# Patient Record
Sex: Female | Born: 1965
Health system: Southern US, Community
[De-identification: ages and names within clinical notes are randomized; demographics above are authoritative.]

## PROBLEM LIST (undated history)

## (undated) DIAGNOSIS — Z973 Presence of spectacles and contact lenses: Secondary | ICD-10-CM

## (undated) DIAGNOSIS — E876 Hypokalemia: Secondary | ICD-10-CM

## (undated) DIAGNOSIS — F419 Anxiety disorder, unspecified: Secondary | ICD-10-CM

## (undated) DIAGNOSIS — F329 Major depressive disorder, single episode, unspecified: Secondary | ICD-10-CM

## (undated) DIAGNOSIS — R350 Frequency of micturition: Secondary | ICD-10-CM

## (undated) DIAGNOSIS — K219 Gastro-esophageal reflux disease without esophagitis: Secondary | ICD-10-CM

## (undated) DIAGNOSIS — R519 Headache, unspecified: Secondary | ICD-10-CM

## (undated) DIAGNOSIS — A64 Unspecified sexually transmitted disease: Secondary | ICD-10-CM

## (undated) DIAGNOSIS — R51 Headache: Secondary | ICD-10-CM

## (undated) DIAGNOSIS — G43909 Migraine, unspecified, not intractable, without status migrainosus: Secondary | ICD-10-CM

## (undated) DIAGNOSIS — F32A Depression, unspecified: Secondary | ICD-10-CM

## (undated) DIAGNOSIS — R079 Chest pain, unspecified: Secondary | ICD-10-CM

## (undated) DIAGNOSIS — I10 Essential (primary) hypertension: Secondary | ICD-10-CM

## (undated) HISTORY — DX: Unspecified sexually transmitted disease: A64

## (undated) HISTORY — DX: Anxiety disorder, unspecified: F41.9

## (undated) HISTORY — DX: Migraine, unspecified, not intractable, without status migrainosus: G43.909

## (undated) HISTORY — PX: COLONOSCOPY: SHX174

---

## 1989-11-17 HISTORY — PX: TUBAL LIGATION: SHX77

## 2011-12-14 ENCOUNTER — Emergency Department (HOSPITAL_COMMUNITY)
Admission: EM | Admit: 2011-12-14 | Discharge: 2011-12-14 | Disposition: A | Discharge status: Home / Self Care | Payer: Self-pay | Attending: Emergency Medicine | Admitting: Emergency Medicine

## 2011-12-14 ENCOUNTER — Other Ambulatory Visit: Payer: Self-pay

## 2011-12-14 ENCOUNTER — Emergency Department (HOSPITAL_COMMUNITY): Payer: Self-pay

## 2011-12-14 DIAGNOSIS — R079 Chest pain, unspecified: Secondary | ICD-10-CM | POA: Insufficient documentation

## 2011-12-14 DIAGNOSIS — Z79899 Other long term (current) drug therapy: Secondary | ICD-10-CM | POA: Insufficient documentation

## 2011-12-14 DIAGNOSIS — R110 Nausea: Secondary | ICD-10-CM | POA: Insufficient documentation

## 2011-12-14 MED ORDER — OXYCODONE-ACETAMINOPHEN 5-325 MG PO TABS
1.0000 | ORAL_TABLET | Freq: Once | ORAL | Status: AC
  Administered 2011-12-14: 1 via ORAL
  Filled 2011-12-14: qty 1

## 2011-12-14 NOTE — ED Provider Notes (Signed)
History     CSN: [id number]  Arrival date & time 12/14/11  0039   First MD Initiated Contact with Patient 12/14/11 0140      Chief Complaint  Patient presents with  . Chest Pain    chest pain for 30 minutes.  No hx of heart problems     Patient is a 46 y.o. female presenting with chest pain. The history is provided by the patient.  Chest Pain The chest pain began 1 - 2 hours ago. Chest pain occurs constantly. The chest pain is unchanged. Associated with: moving in bed and palpation. The severity of the pain is moderate. The quality of the pain is described as dull. The pain does not radiate. Chest pain is worsened by certain positions. Primary symptoms include nausea. Pertinent negatives for primary symptoms include no fever, no syncope and no vomiting.  Associated symptoms include diaphoresis. She tried nothing for the symptoms.   reports having CP previously No known h/o CAD No recent travel/surgery No significant SOB reported  PMH  HTN  No past surgical history on file.  No family history on file.  History  Substance Use Topics  . Smoking status: Not on file  . Smokeless tobacco: Not on file  . Alcohol Use: Not on file    OB History    No data available      Review of Systems  Constitutional: Positive for diaphoresis. Negative for fever.  Cardiovascular: Positive for chest pain. Negative for syncope.  Gastrointestinal: Positive for nausea. Negative for vomiting.  All other systems reviewed and are negative.    Allergies  Review of patient's allergies indicates no known allergies.  Home Medications   Current Outpatient Rx  Name Route Sig Dispense Refill  . FLUOXETINE HCL 20 MG PO CAPS Oral Take 20 mg by mouth daily.    Marland Kitchen HYDROCHLOROTHIAZIDE 12.5 MG PO CAPS Oral Take 12.5 mg by mouth daily.    . IBUPROFEN 800 MG PO TABS Oral Take 800 mg by mouth every 8 (eight) hours as needed. Pain    . PROPRANOLOL HCL 20 MG PO TABS Oral Take 40 mg by mouth 2 (two) times  daily.      BP 151/86  Pulse 75  Temp(Src) 97.5 F (36.4 C) (Oral)  Resp 27  SpO2 100%  Physical Exam CONSTITUTIONAL: Well developed/well nourished HEAD AND FACE: Normocephalic/atraumatic EYES: EOMI/PERRL ENMT: Mucous membranes moist NECK: supple no meningeal signs SPINE:entire spine nontender CV: S1/S2 noted, no murmurs/rubs/gallops noted LUNGS: Lungs are clear to auscultation bilaterally, no apparent distress Chest - tender to palpation, pain with movement in bed and with movement of torso No crepitance noted ABDOMEN: soft, nontender, no rebound or guarding GU:no cva tenderness NEURO: Pt is awake/alert, moves all extremitiesx4 EXTREMITIES: pulses normal, full ROM, no edema noted SKIN: warm, color normal PSYCH: no abnormalities of mood noted  ED Course  Procedures  Labs Reviewed - No data to display Dg Chest 2 View  12/14/2011  *RADIOLOGY REPORT*  Clinical Data: Chest pain.  Cough.  Hypertension.  CHEST - 2 VIEW  Comparison: None.  Findings: The patient to low lung volumes are seen however both lungs are clear.  Heart size is within normal limits.  No evidence of pleural effusion.  IMPRESSION: Low lung volumes.  No active disease.  Original Report Authenticated By: Danae Orleans, M.D.      2:23 AM CP reproducible at this time CP was not pleuritic in nature   My suspicion for ACS/PE was  low, she had reproducible, did not appear in distress, and actually slept through initial exam.  She felt well after meds given. Only concerning associated symptom was diaphoresis but again pain reproducible and she appeared comfortable.  We discussed strict return precautions BP 125/77  Pulse 79  Temp(Src) 98.3 F (36.8 C) (Oral)  Resp 16  SpO2 100% The patient appears reasonably screened and/or stabilized for discharge and I doubt any other medical condition or other [facility] requiring further screening, evaluation, or treatment in the ED at this time prior to discharge.  MDM  Nursing  notes reviewed and considered in documentation xrays reviewed and considered        Date: 12/14/2011  Rate: 76  Rhythm: normal sinus rhythm  QRS Axis: normal  Intervals: normal  ST/T Wave abnormalities: normal  Conduction Disutrbances:none  Narrative Interpretation:   Old EKG Reviewed: none available    Joya Gaskins, MD 12/14/11 [phone]

## 2011-12-14 NOTE — ED Notes (Signed)
Patient transported to X-ray 

## 2011-12-14 NOTE — ED Notes (Signed)
Pt states that she is experiencing a dull, constant pain in her mid epigastric region.  Pt states she feels nauseated and has not vomited.  Pt has hx of same at which time she was cleared of cardiac or gastric diagnoses.

## 2011-12-14 NOTE — ED Notes (Signed)
Blood sent to lab, lt green, blue, purple

## 2012-08-01 ENCOUNTER — Inpatient Hospital Stay (HOSPITAL_COMMUNITY)
Admission: EM | Admit: 2012-08-01 | Discharge: 2012-08-03 | DRG: 287 | Disposition: A | Discharge status: Home / Self Care | Payer: 59 | Attending: Internal Medicine | Admitting: Internal Medicine

## 2012-08-01 ENCOUNTER — Encounter (HOSPITAL_COMMUNITY): Payer: Self-pay | Admitting: Radiology

## 2012-08-01 ENCOUNTER — Other Ambulatory Visit: Payer: Self-pay

## 2012-08-01 ENCOUNTER — Emergency Department (HOSPITAL_COMMUNITY): Payer: Self-pay

## 2012-08-01 DIAGNOSIS — K219 Gastro-esophageal reflux disease without esophagitis: Secondary | ICD-10-CM | POA: Insufficient documentation

## 2012-08-01 DIAGNOSIS — R0789 Other chest pain: Principal | ICD-10-CM | POA: Diagnosis present

## 2012-08-01 DIAGNOSIS — E876 Hypokalemia: Secondary | ICD-10-CM | POA: Diagnosis present

## 2012-08-01 DIAGNOSIS — I200 Unstable angina: Secondary | ICD-10-CM

## 2012-08-01 DIAGNOSIS — I10 Essential (primary) hypertension: Secondary | ICD-10-CM | POA: Diagnosis present

## 2012-08-01 DIAGNOSIS — R079 Chest pain, unspecified: Secondary | ICD-10-CM

## 2012-08-01 HISTORY — DX: Chest pain, unspecified: R07.9

## 2012-08-01 HISTORY — DX: Essential (primary) hypertension: I10

## 2012-08-01 HISTORY — DX: Gastro-esophageal reflux disease without esophagitis: K21.9

## 2012-08-01 HISTORY — DX: Hypokalemia: E87.6

## 2012-08-01 LAB — COMPREHENSIVE METABOLIC PANEL
ALT: 13 U/L (ref 0–35)
AST: 13 U/L (ref 0–37)
Albumin: 3.9 g/dL (ref 3.5–5.2)
Alkaline Phosphatase: 74 U/L (ref 39–117)
BUN: 16 mg/dL (ref 6–23)
CO2: 24 mEq/L (ref 19–32)
Calcium: 9.6 mg/dL (ref 8.4–10.5)
Chloride: 102 mEq/L (ref 96–112)
Creatinine, Ser: 0.9 mg/dL (ref 0.50–1.10)
GFR calc Af Amer: 87 mL/min — ABNORMAL LOW (ref >90)
GFR calc non Af Amer: 75 mL/min — ABNORMAL LOW (ref >90)
Glucose, Bld: 102 mg/dL — ABNORMAL HIGH (ref 70–99)
Potassium: 2.9 mEq/L — ABNORMAL LOW (ref 3.5–5.1)
Sodium: 138 mEq/L (ref 135–145)
Total Bilirubin: 0.3 mg/dL (ref 0.3–1.2)
Total Protein: 7.8 g/dL (ref 6.0–8.3)

## 2012-08-01 LAB — BASIC METABOLIC PANEL
BUN: 13 mg/dL (ref 6–23)
CO2: 23 mEq/L (ref 19–32)
Calcium: 9.3 mg/dL (ref 8.4–10.5)
Chloride: 101 mEq/L (ref 96–112)
Creatinine, Ser: 0.87 mg/dL (ref 0.50–1.10)
GFR calc Af Amer: >90 mL/min (ref >90)
GFR calc non Af Amer: 78 mL/min — ABNORMAL LOW (ref >90)
Glucose, Bld: 118 mg/dL — ABNORMAL HIGH (ref 70–99)
Potassium: 3 mEq/L — ABNORMAL LOW (ref 3.5–5.1)
Sodium: 137 mEq/L (ref 135–145)

## 2012-08-01 LAB — TROPONIN I
Troponin I: <0.3 ng/mL (ref <0.30)
Troponin I: <0.3 ng/mL (ref <0.30)

## 2012-08-01 LAB — CBC
HCT: 34.3 % — ABNORMAL LOW (ref 36.0–46.0)
Hemoglobin: 11.5 g/dL — ABNORMAL LOW (ref 12.0–15.0)
MCH: 30.9 pg (ref 26.0–34.0)
MCHC: 33.5 g/dL (ref 30.0–36.0)
MCV: 92.2 fL (ref 78.0–100.0)
Platelets: 278 10*3/uL (ref 150–400)
RBC: 3.72 MIL/uL — ABNORMAL LOW (ref 3.87–5.11)
RDW: 13.3 % (ref 11.5–15.5)
WBC: 6.2 10*3/uL (ref 4.0–10.5)

## 2012-08-01 LAB — POCT I-STAT TROPONIN I: Troponin i, poc: 0 ng/mL (ref 0.00–0.08)

## 2012-08-01 MED ORDER — ATENOLOL 25 MG PO TABS
25.0000 mg | ORAL_TABLET | Freq: Every day | ORAL | Status: AC
  Administered 2012-08-02: 25 mg via ORAL
  Filled 2012-08-01: qty 1

## 2012-08-01 MED ORDER — PANTOPRAZOLE SODIUM 40 MG PO TBEC
40.0000 mg | DELAYED_RELEASE_TABLET | Freq: Every day | ORAL | Status: DC
  Administered 2012-08-01 – 2012-08-02 (×2): 40 mg via ORAL
  Filled 2012-08-01 (×3): qty 1

## 2012-08-01 MED ORDER — POTASSIUM CHLORIDE CRYS ER 20 MEQ PO TBCR
40.0000 meq | EXTENDED_RELEASE_TABLET | Freq: Once | ORAL | Status: AC
  Administered 2012-08-01: 40 meq via ORAL
  Filled 2012-08-01: qty 2

## 2012-08-01 MED ORDER — SODIUM CHLORIDE 0.9 % IV SOLN: 250.0000 mL | INTRAVENOUS | Status: DC | PRN

## 2012-08-01 MED ORDER — POTASSIUM CHLORIDE CRYS ER 20 MEQ PO TBCR
40.0000 meq | EXTENDED_RELEASE_TABLET | ORAL | Status: AC
  Administered 2012-08-01 – 2012-08-02 (×2): 40 meq via ORAL
  Filled 2012-08-01 (×2): qty 2

## 2012-08-01 MED ORDER — ASPIRIN 81 MG PO CHEW
324.0000 mg | CHEWABLE_TABLET | ORAL | Status: AC
  Administered 2012-08-01: 324 mg via ORAL

## 2012-08-01 MED ORDER — AMITRIPTYLINE HCL 25 MG PO TABS
25.0000 mg | ORAL_TABLET | Freq: Every day | ORAL | Status: DC
  Administered 2012-08-01 – 2012-08-02 (×2): 25 mg via ORAL
  Filled 2012-08-01 (×3): qty 1

## 2012-08-01 MED ORDER — IBUPROFEN 800 MG PO TABS
800.0000 mg | ORAL_TABLET | Freq: Three times a day (TID) | ORAL | Status: DC | PRN
  Filled 2012-08-01: qty 1

## 2012-08-01 MED ORDER — ASPIRIN 300 MG RE SUPP
300.0000 mg | RECTAL | Status: AC
  Filled 2012-08-01: qty 1

## 2012-08-01 MED ORDER — ATORVASTATIN CALCIUM 10 MG PO TABS
10.0000 mg | ORAL_TABLET | Freq: Every day | ORAL | Status: DC
  Administered 2012-08-01 – 2012-08-02 (×2): 10 mg via ORAL
  Filled 2012-08-01 (×3): qty 1

## 2012-08-01 MED ORDER — ASPIRIN EC 81 MG PO TBEC
81.0000 mg | DELAYED_RELEASE_TABLET | Freq: Every day | ORAL | Status: DC
  Administered 2012-08-03: 81 mg via ORAL
  Filled 2012-08-01 (×2): qty 1

## 2012-08-01 MED ORDER — NITROGLYCERIN 0.4 MG SL SUBL: 0.4000 mg | SUBLINGUAL_TABLET | SUBLINGUAL | Status: DC | PRN

## 2012-08-01 MED ORDER — ACETAMINOPHEN 325 MG PO TABS
650.0000 mg | ORAL_TABLET | ORAL | Status: DC | PRN
  Administered 2012-08-01 – 2012-08-02 (×2): 650 mg via ORAL
  Filled 2012-08-01 (×2): qty 2

## 2012-08-01 MED ORDER — SODIUM CHLORIDE 0.9 % IJ SOLN
3.0000 mL | Freq: Two times a day (BID) | INTRAMUSCULAR | Status: DC
  Administered 2012-08-01 – 2012-08-02 (×2): 3 mL via INTRAVENOUS

## 2012-08-01 MED ORDER — ENOXAPARIN SODIUM 120 MG/0.8ML ~~LOC~~ SOLN
115.0000 mg | Freq: Two times a day (BID) | SUBCUTANEOUS | Status: DC
  Administered 2012-08-01 – 2012-08-02 (×2): 115 mg via SUBCUTANEOUS
  Filled 2012-08-01 (×5): qty 0.8

## 2012-08-01 MED ORDER — ONDANSETRON HCL 4 MG/2ML IJ SOLN: 4.0000 mg | Freq: Four times a day (QID) | INTRAMUSCULAR | Status: DC | PRN

## 2012-08-01 MED ORDER — SODIUM CHLORIDE 0.9 % IJ SOLN: 3.0000 mL | INTRAMUSCULAR | Status: DC | PRN
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
## 2012-08-01 NOTE — ED Provider Notes (Signed)
12:19 PM  Date: 08/01/2012  Rate: 106  Rhythm: sinus tachycardia  QRS Axis: normal  Intervals: normal QRS:  Deep Q wave in III suggests possible old inferior myocardial infarction.  Poor R wave progression in V3 suggests possible old anterior myocardial infarction.    ST/T Wave abnormalities: normal  Conduction Disutrbances:none  Narrative Interpretation: Abnormal EKG  Old EKG Reviewed: changes noted--Q in III, poor R wave progression in V3 are new since 12/14/2011.    Carleene Cooper III, MD 08/01/12 [phone]
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
## 2012-08-01 NOTE — ED Notes (Signed)
Consulting MD at bedside

## 2012-08-01 NOTE — ED Provider Notes (Signed)
Medical screening examination/treatment/procedure(s) were performed by non-physician practitioner and as supervising physician I was immediately available for consultation/collaboration.   Sujey Gundry III, MD 08/01/12 1639 
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
## 2012-08-02 ENCOUNTER — Encounter (HOSPITAL_COMMUNITY)
Admission: EM | Disposition: A | Discharge status: Home / Self Care | Payer: Self-pay | Source: Home / Self Care | Attending: Internal Medicine

## 2012-08-02 ENCOUNTER — Encounter (HOSPITAL_COMMUNITY): Payer: Self-pay | Admitting: Cardiology

## 2012-08-02 DIAGNOSIS — R079 Chest pain, unspecified: Secondary | ICD-10-CM

## 2012-08-02 HISTORY — PX: LEFT HEART CATHETERIZATION WITH CORONARY ANGIOGRAM: SHX5451

## 2012-08-02 LAB — BASIC METABOLIC PANEL
BUN: 15 mg/dL (ref 6–23)
CO2: 24 mEq/L (ref 19–32)
Calcium: 9.3 mg/dL (ref 8.4–10.5)
Chloride: 105 mEq/L (ref 96–112)
Creatinine, Ser: 0.86 mg/dL (ref 0.50–1.10)
GFR calc Af Amer: >90 mL/min (ref >90)
GFR calc non Af Amer: 79 mL/min — ABNORMAL LOW (ref >90)
Glucose, Bld: 104 mg/dL — ABNORMAL HIGH (ref 70–99)
Potassium: 3.9 mEq/L (ref 3.5–5.1)
Sodium: 139 mEq/L (ref 135–145)

## 2012-08-02 LAB — LIPID PANEL
Cholesterol: 163 mg/dL (ref 0–200)
HDL: 43 mg/dL (ref >39)
LDL Cholesterol: 101 mg/dL — ABNORMAL HIGH (ref 0–99)
Total CHOL/HDL Ratio: 3.8 RATIO
Triglycerides: 95 mg/dL (ref <150)
VLDL: 19 mg/dL (ref 0–40)

## 2012-08-02 LAB — TROPONIN I: Troponin I: 0.4 ng/mL (ref <0.30)

## 2012-08-02 LAB — CK TOTAL AND CKMB (NOT AT ARMC)
CK, MB: 2.1 ng/mL (ref 0.3–4.0)
Relative Index: 0.8 (ref 0.0–2.5)
Total CK: 248 U/L — ABNORMAL HIGH (ref 7–177)

## 2012-08-02 LAB — PROTIME-INR
INR: 1.1 (ref 0.00–1.49)
Prothrombin Time: 14.4 seconds (ref 11.6–15.2)

## 2012-08-02 LAB — TSH: TSH: 1.795 u[IU]/mL (ref 0.350–4.500)

## 2012-08-02 LAB — HEMOGLOBIN A1C
Hgb A1c MFr Bld: 5 % (ref <5.7)
Mean Plasma Glucose: 97 mg/dL (ref <117)

## 2012-08-02 LAB — D-DIMER, QUANTITATIVE: D-Dimer, Quant: 0.3 ug/mL-FEU (ref 0.00–0.48)

## 2012-08-02 LAB — APTT: aPTT: 39 seconds — ABNORMAL HIGH (ref 24–37)

## 2012-08-02 SURGERY — LEFT HEART CATHETERIZATION WITH CORONARY ANGIOGRAM
Anesthesia: LOCAL

## 2012-08-02 MED ORDER — SODIUM CHLORIDE 0.9 % IJ SOLN: 3.0000 mL | Freq: Two times a day (BID) | INTRAMUSCULAR | Status: DC

## 2012-08-02 MED ORDER — ASPIRIN 81 MG PO CHEW
324.0000 mg | CHEWABLE_TABLET | ORAL | Status: AC
  Administered 2012-08-02: 324 mg via ORAL
  Filled 2012-08-02: qty 4

## 2012-08-02 MED ORDER — SODIUM CHLORIDE 0.9 % IJ SOLN
3.0000 mL | INTRAMUSCULAR | Status: DC | PRN
  Administered 2012-08-02: 3 mL via INTRAVENOUS

## 2012-08-02 MED ORDER — LIDOCAINE HCL (PF) 1 % IJ SOLN
INTRAMUSCULAR | Status: AC
  Filled 2012-08-02: qty 30

## 2012-08-02 MED ORDER — SODIUM CHLORIDE 0.9 % IV SOLN
INTRAVENOUS | Status: DC
  Administered 2012-08-02 – 2012-08-03 (×2): via INTRAVENOUS

## 2012-08-02 MED ORDER — SODIUM CHLORIDE 0.9 % IV SOLN: 250.0000 mL | INTRAVENOUS | Status: DC | PRN

## 2012-08-02 MED ORDER — METOPROLOL SUCCINATE ER 25 MG PO TB24
25.0000 mg | ORAL_TABLET | Freq: Every day | ORAL | Status: DC
  Administered 2012-08-02 – 2012-08-03 (×2): 25 mg via ORAL
  Filled 2012-08-02 (×2): qty 1

## 2012-08-02 MED ORDER — HYDROCHLOROTHIAZIDE 25 MG PO TABS
25.0000 mg | ORAL_TABLET | Freq: Every day | ORAL | Status: DC
  Administered 2012-08-02 – 2012-08-03 (×2): 25 mg via ORAL
  Filled 2012-08-02 (×2): qty 1

## 2012-08-02 MED ORDER — NITROGLYCERIN 0.2 MG/ML ON CALL CATH LAB
INTRAVENOUS | Status: AC
  Filled 2012-08-02: qty 1

## 2012-08-02 MED ORDER — HEPARIN (PORCINE) IN NACL 2-0.9 UNIT/ML-% IJ SOLN
INTRAMUSCULAR | Status: AC
  Filled 2012-08-02: qty 1000

## 2012-08-02 MED ORDER — MIDAZOLAM HCL 2 MG/2ML IJ SOLN
INTRAMUSCULAR | Status: AC
  Filled 2012-08-02: qty 2

## 2012-08-02 MED ORDER — SODIUM CHLORIDE 0.9 % IV SOLN
INTRAVENOUS | Status: DC
  Administered 2012-08-02: 11:00:00 via INTRAVENOUS

## 2012-08-02 MED ORDER — POTASSIUM CHLORIDE CRYS ER 10 MEQ PO TBCR
10.0000 meq | EXTENDED_RELEASE_TABLET | Freq: Every day | ORAL | Status: DC
  Administered 2012-08-02 – 2012-08-03 (×2): 10 meq via ORAL
  Filled 2012-08-02 (×2): qty 1

## 2012-08-02 MED ORDER — FENTANYL CITRATE 0.05 MG/ML IJ SOLN
INTRAMUSCULAR | Status: AC
  Filled 2012-08-02: qty 2
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
## 2012-08-02 NOTE — CV Procedure (Signed)
   Cardiac Catheterization Procedure Note  Name: Erin Young MRN: [id number] DOB: 09/21/1966  Procedure: Left Heart Cath, Selective Coronary Angiography, LV angiography, Aortic root angiography  Indication: Chest pain with positive cardiac enzymes.  Patient seen by Dr. Graciela Husbands and set up for cardiac catheterization.    Procedural details: The patient had an abnormal Allen's test, and thus the right groin was prepped, draped, and anesthetized with 1% lidocaine. Using modified Seldinger technique, a 4 French sheath was introduced into the right femoral artery. A L5 diagnostic was required for the LCA and we could not initially find the RCA with standard or 3DRC diagnostic catheters.  Then, LV angio was performed followed by an aortic root after exchanging for 71F catheters  We then used an LCB 71F Guide which seated to the anterior takeoff RCA. Catheter exchanges were performed over a guidewire. There were no immediate procedural complications. The patient was transferred to the post catheterization recovery area for further monitoring.  Procedural Findings: Hemodynamics:  AO 115/75 (92) LV 113/13 No pullback catheters   Coronary angiography: Coronary dominance: right  Left mainstem: Large caliber and without significant narrowing  Left anterior descending (LAD): Courses to the apex and divides with two diagonal branches.  No significant obstruction.   Left circumflex (LCx): Provides two large marginal branches and is free of disease  Right coronary artery (RCA): Anterior takeoff near the junction of the right and left coronary cusps.  Provides a single PDA and small PLA.  Minor mid irregularity and no obstruction.  Left ventriculography: Left ventricular systolic function is normal, LVEF is estimated at 55-65%, there is no significant mitral regurgitation.  Prox aortic root demonstrates RCA anterior takeoff, without obvious dissection and with no AI   Final Conclusions:   1.  No high  grade coronary obstruction  Recommendations:  1.  Evaluate other causes of chest pain 2.  Historically needs a sleep study. 3.  D dimer ordered.   [name] 08/02/2012, 4:14 PM
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
## 2012-08-02 NOTE — Interval H&P Note (Signed)
History and Physical Interval Note:  08/02/2012 2:56 PM  Erin Young  has presented today for surgery, with the diagnosis of cp  The various methods of treatment have been discussed with the patient. After consideration of risks, benefits and other options for treatment, the patient has consented to  Procedure(s) (LRB) with comments: LEFT HEART CATHETERIZATION WITH CORONARY ANGIOGRAM (N/A) as a surgical intervention .  The patient's history has been reviewed, patient examined, no change in status, stable for surgery.  I have reviewed the patient's chart and labs.  Questions were answered to the patient's satisfaction.  She has made it clear that she understands the risks.  Dr. Graciela Husbands has ordered the test.    She received Enoxapirin approximately 9 hours earlier.  She has a dampened Allen's waveform.  Femoral approach with 4 F is recommended.     [name]

## 2012-08-02 NOTE — Progress Notes (Signed)
Pt troponin this am 0.4.  Pt denies any chest pain. VSS - this am EKG with no acute changes.  Lavella Hammock PA-C advised.  Will continue to monitor.

## 2012-08-02 NOTE — Progress Notes (Signed)
UR Completed Arias Weinert Graves-Bigelow, RN,BSN [phone]  

## 2012-08-02 NOTE — Progress Notes (Signed)
Stable post cath.  D-dimer is normal.  Groin is stable. BP meds reorganized changing atenolol----to metoprolol.  And K added to HCTZ regimen.
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
## 2012-08-03 MED ORDER — PANTOPRAZOLE SODIUM 40 MG PO TBEC: 40.0000 mg | DELAYED_RELEASE_TABLET | Freq: Every day | ORAL | Status: DC

## 2012-08-03 MED ORDER — METOPROLOL SUCCINATE ER 25 MG PO TB24: 25.0000 mg | ORAL_TABLET | Freq: Every day | ORAL | Status: DC

## 2012-08-03 MED ORDER — POTASSIUM CHLORIDE CRYS ER 10 MEQ PO TBCR: 10.0000 meq | EXTENDED_RELEASE_TABLET | Freq: Every day | ORAL | Status: DC
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
## 2013-12-26 ENCOUNTER — Encounter: Payer: Self-pay | Admitting: *Deleted

## 2014-01-11 ENCOUNTER — Encounter: Payer: Self-pay | Admitting: Obstetrics & Gynecology

## 2014-07-31 ENCOUNTER — Encounter: Payer: Self-pay | Admitting: Nurse Practitioner

## 2014-07-31 ENCOUNTER — Ambulatory Visit (INDEPENDENT_AMBULATORY_CARE_PROVIDER_SITE_OTHER): Payer: 59 | Admitting: Nurse Practitioner

## 2014-07-31 VITALS — BP 139/81 | HR 71 | Temp 98.5°F | Ht 62.5 in | Wt 251.3 lb

## 2014-07-31 DIAGNOSIS — Z6841 Body Mass Index (BMI) 40.0 and over, adult: Secondary | ICD-10-CM

## 2014-07-31 DIAGNOSIS — Z1151 Encounter for screening for human papillomavirus (HPV): Secondary | ICD-10-CM

## 2014-07-31 DIAGNOSIS — Z01419 Encounter for gynecological examination (general) (routine) without abnormal findings: Secondary | ICD-10-CM

## 2014-07-31 DIAGNOSIS — Z1159 Encounter for screening for other viral diseases: Secondary | ICD-10-CM

## 2014-07-31 DIAGNOSIS — Z118 Encounter for screening for other infectious and parasitic diseases: Secondary | ICD-10-CM

## 2014-07-31 DIAGNOSIS — Z23 Encounter for immunization: Secondary | ICD-10-CM

## 2014-07-31 DIAGNOSIS — N898 Other specified noninflammatory disorders of vagina: Secondary | ICD-10-CM

## 2014-07-31 DIAGNOSIS — Z124 Encounter for screening for malignant neoplasm of cervix: Secondary | ICD-10-CM

## 2014-07-31 LAB — CBC WITH DIFFERENTIAL/PLATELET
Basophils Absolute: 0 10*3/uL (ref 0.0–0.1)
Basophils Relative: 1 % (ref 0–1)
Eosinophils Absolute: 0.5 10*3/uL (ref 0.0–0.7)
Eosinophils Relative: 11 % — ABNORMAL HIGH (ref 0–5)
HCT: 31.3 % — ABNORMAL LOW (ref 36.0–46.0)
Hemoglobin: 10.4 g/dL — ABNORMAL LOW (ref 12.0–15.0)
Lymphocytes Relative: 29 % (ref 12–46)
Lymphs Abs: 1.3 10*3/uL (ref 0.7–4.0)
MCH: 29.5 pg (ref 26.0–34.0)
MCHC: 33.2 g/dL (ref 30.0–36.0)
MCV: 88.7 fL (ref 78.0–100.0)
Monocytes Absolute: 0.3 10*3/uL (ref 0.1–1.0)
Monocytes Relative: 7 % (ref 3–12)
Neutro Abs: 2.3 10*3/uL (ref 1.7–7.7)
Neutrophils Relative %: 52 % (ref 43–77)
Platelets: 390 10*3/uL (ref 150–400)
RBC: 3.53 MIL/uL — ABNORMAL LOW (ref 3.87–5.11)
RDW: 14.4 % (ref 11.5–15.5)
WBC: 4.4 10*3/uL (ref 4.0–10.5)

## 2014-07-31 LAB — TSH: TSH: 2.179 u[IU]/mL (ref 0.350–4.500)

## 2014-07-31 NOTE — Progress Notes (Signed)
History:  Erin Young is a 48 y.o. G3P3. who presents to [facility] clinic today for well woman's exam, pap smear, STD screening and mammogram. She has not been to GYN in 10 years due to lack of health insurance and money. She has never had an abnormal pap. She has never had a mammogram. She had her tubes tied 24 years ago. Her menses in the last year has become irregular. She recently remarried. She denies any vaginal or breast issues.   The following portions of the patient's history were reviewed and updated as appropriate: allergies, current medications, past family history, past medical history, past social history, past surgical history and problem list.  Review of Systems:    Objective:  Physical Exam BP 139/81  Pulse 71  Temp(Src) 98.5 F (36.9 C)  Ht 5' 2.5" (1.588 m)  Wt 251 lb 4.8 oz (113.989 kg)  BMI 45.20 kg/m2  LMP 06/17/2014 GENERAL: Well-developed, well-nourished female in no acute distress. Morbid Obesity.  HEENT: Normocephalic, atraumatic. Teeth poor repair/ missing many NECK: Supple. Normal thyroid.  LUNGS: Normal rate. Clear to auscultation bilaterally.  HEART: Regular rate and rhythm with no adventitious sounds.  BREASTS: Symmetric in size. No masses, skin changes, nipple drainage, or lymphadenopathy. ABDOMEN: Soft, nontender, nondistended. No organomegaly. Normal bowel sounds appreciated in all quadrants.  PELVIC: Normal external female genitalia. Vagina is pink and rugated.  Normal discharge. Normal cervix contour. Pap smear obtained. Uterus is normal in size. No adnexal mass or tenderness. Lipoma on left inner thigh EXTREMITIES: No cyanosis, clubbing, or edema, 2+ distal pulses.   Labs and Imaging No results found for this or any previous visit (from the past 24 hour(s)).   Assessment & Plan:  Assessment: Well Woman Exam  Plans: Pap, GC, Chlamydia, HIV, TSH, RPR, CMET, CBC,  Mammogram Diagnostic Flu Vaccine Follow up with results and year exam Pt  has an appointment with new PCP at [facility], NP 07/31/2014 5:06 PM

## 2014-07-31 NOTE — Patient Instructions (Signed)
Mammography  Mammography is an X-ray of the breasts. The X-ray image of the breast is called a mammogram. This test can:  Find changes in the breast that are not normal.  Look for early signs of cancer.  Find cancer.  Diagnose cancer. BEFORE THE PROCEDURE  Make your test about 7 days after your period (menses).  If you have a new doctor or clinic, send any past mammogram images to your new doctor's office.  Wash your breasts and under your arms the day of the test.  Do not wear deodorant, perfume, or powder on your body.  Wear clothes that you can change in and out of easily. PROCEDURE  Try to relax during the test.   You will get undressed from the waist up. You will put on a gown.  You will stand in front of the X-ray machine.  Each breast will be placed between 2 plastic or glass plates. The plates will press down on your breast.  X-rays will be taken of the breast from different angles. The test should take less than 30 minutes. AFTER THE PROCEDURE  The X-ray image will be looked at carefully.  You may need to do certain parts of the test again if the images are unclear.  Ask when your test results will be ready. Get your test results.  You may go back to your normal activities. Document Released: 01/26/2012 Document Reviewed: 01/26/2012 [facility] Patient Information 2015 [place]. This information is not intended to replace advice given to you by your health care provider. Make sure you discuss any questions you have with your health care provider.

## 2014-08-01 ENCOUNTER — Telehealth: Payer: Self-pay

## 2014-08-01 DIAGNOSIS — A5901 Trichomonal vulvovaginitis: Secondary | ICD-10-CM

## 2014-08-01 LAB — HIV ANTIBODY (ROUTINE TESTING W REFLEX): HIV 1&2 Ab, 4th Generation: NONREACTIVE

## 2014-08-01 LAB — COMPREHENSIVE METABOLIC PANEL
ALT: 15 U/L (ref 0–35)
AST: 15 U/L (ref 0–37)
Albumin: 4.5 g/dL (ref 3.5–5.2)
Alkaline Phosphatase: 53 U/L (ref 39–117)
BUN: 19 mg/dL (ref 6–23)
CO2: 26 mEq/L (ref 19–32)
Calcium: 8.9 mg/dL (ref 8.4–10.5)
Chloride: 104 mEq/L (ref 96–112)
Creat: 0.88 mg/dL (ref 0.50–1.10)
Glucose, Bld: 92 mg/dL (ref 70–99)
Potassium: 3.9 mEq/L (ref 3.5–5.3)
Sodium: 141 mEq/L (ref 135–145)
Total Bilirubin: 0.4 mg/dL (ref 0.2–1.2)
Total Protein: 7.1 g/dL (ref 6.0–8.3)

## 2014-08-01 LAB — WET PREP, GENITAL
Trich, Wet Prep: NONE SEEN
Yeast Wet Prep HPF POC: NONE SEEN

## 2014-08-01 LAB — RPR

## 2014-08-01 NOTE — Telephone Encounter (Signed)
Attempted to call patient. No answer. Left message stating we are calling to inform your of results, non-urgent, please call clinic.

## 2014-08-01 NOTE — Telephone Encounter (Signed)
Message copied by Geanie Logan on Tue Aug 01, 2014 10:54 AM ------      Message from: BAREFOOT, LINDA M      Created: Tue Aug 01, 2014 10:20 AM       Can you let her know that most of her testing is back and so far it all looks good , except she is anemic and needs to start taking a multiple vitamin with iron and eating foods rich in iron. Thank you, Vaughan Basta ------

## 2014-08-02 LAB — CYTOLOGY - PAP

## 2014-08-02 NOTE — Telephone Encounter (Signed)
Patient returned call. Attempted to call back. No answer. Left message stating we are returning your call regarding results, if you would like [place] to leave the results on your voicemail please call back and leave a message stating it is OK to do so.

## 2014-08-02 NOTE — Telephone Encounter (Signed)
Attempted to call patient. No answer. Left message stating we are calling with results, please call clinic.

## 2014-08-03 MED ORDER — METRONIDAZOLE 500 MG PO TABS: 2000.0000 mg | ORAL_TABLET | Freq: Once | ORAL | Status: DC

## 2014-08-03 NOTE — Telephone Encounter (Signed)
Patient returned call and stated if she is unable to answer it is OK to leave detailed information on her voicemail. Called patient. No answer. Left message stating " Your results were overwhelmingly normal except that your blood draw did show you are anemic, for this we would like you to start taking a multivitamin and eating foods rich in iron, for example, green leafy veggies like spinach and kale, nuts, beans and red meat. Your pap results were normal, however, it did show that you have trichomonas, a sexually transmitted infection. A prescription has been sent to your pharmacy for this-- you will take 4 tablets at one time. Your partner will also need to be treated for this and you should abstain from sex for at least 7 days after you are both treated. If you have any questions or concerns please call our clinic."

## 2014-08-03 NOTE — Telephone Encounter (Addendum)
Flagyl 2gm e-prescribed per protocol for treatment of trich. Patient to be informed.   Message copied by Geanie Logan on Thu Aug 03, 2014  7:45 AM ------      Message from: BAREFOOT, LINDA M      Created: Wed Aug 02, 2014 10:14 PM       Can you let her know she has trich and call her in Flagyl 2 Grams and ask her partner to be treated. No intercourse till both are treated. Thanks, Vaughan Basta ------

## 2014-08-16 ENCOUNTER — Ambulatory Visit (HOSPITAL_COMMUNITY)
Admission: RE | Admit: 2014-08-16 | Discharge: 2014-08-16 | Disposition: A | Discharge status: Home / Self Care | Payer: Private Health Insurance - Indemnity | Source: Ambulatory Visit | Attending: Nurse Practitioner | Admitting: Nurse Practitioner

## 2014-08-16 DIAGNOSIS — Z1231 Encounter for screening mammogram for malignant neoplasm of breast: Secondary | ICD-10-CM | POA: Insufficient documentation

## 2014-08-16 DIAGNOSIS — Z01419 Encounter for gynecological examination (general) (routine) without abnormal findings: Secondary | ICD-10-CM

## 2014-10-26 ENCOUNTER — Encounter (HOSPITAL_COMMUNITY): Payer: Self-pay | Admitting: Cardiovascular Disease

## 2015-04-13 ENCOUNTER — Ambulatory Visit (INDEPENDENT_AMBULATORY_CARE_PROVIDER_SITE_OTHER): Payer: 59 | Admitting: Medical

## 2015-04-13 ENCOUNTER — Encounter: Payer: Self-pay | Admitting: Medical

## 2015-04-13 ENCOUNTER — Other Ambulatory Visit: Payer: Self-pay | Admitting: Medical

## 2015-04-13 VITALS — BP 147/89 | HR 86 | Temp 98.6°F | Ht 62.0 in | Wt 247.2 lb

## 2015-04-13 DIAGNOSIS — R350 Frequency of micturition: Secondary | ICD-10-CM

## 2015-04-13 DIAGNOSIS — D649 Anemia, unspecified: Secondary | ICD-10-CM

## 2015-04-13 DIAGNOSIS — I10 Essential (primary) hypertension: Secondary | ICD-10-CM

## 2015-04-13 DIAGNOSIS — N898 Other specified noninflammatory disorders of vagina: Secondary | ICD-10-CM

## 2015-04-13 LAB — POCT URINALYSIS DIP (DEVICE)
Glucose, UA: NEGATIVE mg/dL
Hgb urine dipstick: NEGATIVE
Ketones, ur: NEGATIVE mg/dL
Nitrite: NEGATIVE
Protein, ur: 30 mg/dL — AB
Specific Gravity, Urine: >=1.03 (ref 1.005–1.030)
Urobilinogen, UA: 0.2 mg/dL (ref 0.0–1.0)
pH: 5.5 (ref 5.0–8.0)

## 2015-04-13 LAB — POCT PREGNANCY, URINE: Preg Test, Ur: NEGATIVE

## 2015-04-13 NOTE — Progress Notes (Signed)
Patient ID: Erin Young, female   DOB: Jan 09, 1966, 49 y.o.   MRN: [id number]  History:  Erin Young is a 49 y.o.  who presents to clinic today for brown vaginal discharge x 4-5 days. She denies odor, itching, irritation, burning, UTI symptoms, fever, pelvic pain. She is sexually active with last intercourse last week. Patient had BTL 24 years ago. LMP was 08/2014.  Patient states history of irregular periods, usually ~ 3 months between cycles.   No Known Allergies  Current Outpatient Prescriptions on File Prior to Visit  Medication Sig Dispense Refill  . famotidine (PEPCID) 10 MG tablet Take 10 mg by mouth daily.    . hydrochlorothiazide (HYDRODIURIL) 25 MG tablet Take 25 mg by mouth daily.    Marland Kitchen ibuprofen (ADVIL,MOTRIN) 800 MG tablet Take 800 mg by mouth every 8 (eight) hours as needed. Pain    . amitriptyline (ELAVIL) 25 MG tablet Take 25 mg by mouth at bedtime.    . cloNIDine (CATAPRES) 0.1 MG tablet Take 0.1 mg by mouth 2 (two) times daily.     No current facility-administered medications on file prior to visit.    Patient Active Problem List   Diagnosis Date Noted  . Morbid obesity with BMI of 45.0-49.9, adult 07/31/2014  . Unstable angina 08/01/2012  . Hypertension, essential   . GERD (gastroesophageal reflux disease)   . Hypokalemia     The following portions of the patient's history were reviewed and updated as appropriate: allergies, current medications, family history, past medical history, social history, past surgical history and problem list.  Review of Systems:  Other than those mentioned in HPI all ROS negative  Objective:  Physical Exam BP 147/89 mmHg  Pulse 86  Temp(Src) 98.6 F (37 C)  Ht 5\' 2"  (1.575 m)  Wt 247 lb 3.2 oz (112.129 kg)  BMI 45.20 kg/m2  LMP 08/31/2014 CONSTITUTIONAL: Well-developed, well-nourished female in no acute distress.  EYES: EOM intact, conjunctivae normal, no scleral icterus HEAD: Normocephalic, atraumatic ENT: External right and  left ear normal, oropharynx is clear and moist. CARDIOVASCULAR: Normal heart rate noted RESPIRATORY: Normal effort GENITOURINARY: Normal appearing external genitalia; normal appearing vaginal mucosa and cervix.  Small amount of thin, white discharge.  Normal uterine size, no other palpable masses, no uterine or adnexal tenderness.  MUSCULOSKELETAL: Normal range of motion. No tenderness. SKIN: Skin is warm and dry. No rash noted. Not diaphoretic. No erythema. No pallor. [place]: Alert and oriented to person, place, and time. Normal reflexes, muscle tone, coordination. No cranial nerve deficit noted. PSYCHIATRIC: Normal mood and affect. Normal behavior. Normal judgment and thought content.  MDM Last pap smear was 2015 and normal  Assessment & Plan:  Assessment: Vaginal discharge Hypertension  Plans: UA, Wet prep and GC/Chlamydia obtained. Patient will be contacted with any abnormal results Urine culture sent Patient advised to follow-up with PCP for blood pressure management and seek immediate medical attention for sign/symptoms of severe HTN/CVA; warning signs discussed Patient to return to [place] PRN  Luvenia Redden, PA-C 04/13/2015 9:15 AM

## 2015-04-13 NOTE — Patient Instructions (Signed)

## 2015-04-14 LAB — WET PREP, GENITAL
Clue Cells Wet Prep HPF POC: NONE SEEN
Trich, Wet Prep: NONE SEEN
Yeast Wet Prep HPF POC: NONE SEEN

## 2015-04-14 LAB — GC/CHLAMYDIA PROBE AMP
CT Probe RNA: NEGATIVE
GC Probe RNA: NEGATIVE

## 2015-04-15 LAB — URINE CULTURE: Colony Count: >=100000

## 2015-08-07 ENCOUNTER — Ambulatory Visit: Payer: Self-pay | Admitting: Surgery

## 2015-08-07 NOTE — H&P (Signed)
History of Present Illness Erin Young. Noor Witte MD; 08/07/2015 3:09 PM) Patient words: lipoma.  The patient is a 49 year old female who presents with a complaint of Mass. Referred by Dr. Kelton Pillar for evaluation of right thigh mass This is a 49 year old female who presents with a 10 year history of an enlarging mass in her proximal right thigh near her groin. This has become fairly large and has caused some discomfort. It has never become infected or had any drainage. It does cause some irritation of the opposite thigh because of rubbing. She would like to have this removed. Other Problems (Ammie Eversole, LPN; 9/41/7408 1:44 PM) Anxiety Disorder Depression Gastric Ulcer High blood pressure Migraine Headache  Past Surgical History (Ammie Eversole, LPN; 07/04/5630 4:97 PM) No pertinent past surgical history  Diagnostic Studies History (Ammie Eversole, LPN; 0/26/3785 8:85 PM) Colonoscopy never Mammogram 1-3 years ago Pap Smear 1-5 years ago  Allergies (Ammie Eversole, LPN; 0/27/7412 8:78 PM) No Known Drug Allergies 08/07/2015  Medication History (Ammie Eversole, LPN; 6/76/7209 4:70 PM) Metoprolol Tartrate (50MG  Tablet, Oral) Active. Hydrochlorothiazide (25MG  Tablet, Oral) Active. Famotidine (20MG  Tablet, Oral) Active. Medications Reconciled  Social History (Ammie Eversole, LPN; 9/62/8366 2:94 PM) Alcohol use Occasional alcohol use. Caffeine use Carbonated beverages, Coffee. No drug use Tobacco use Never smoker.  Family History (Aleatha Borer, LPN; 7/65/4650 3:54 PM) Arthritis Mother. Diabetes Mellitus Mother. Hypertension Father, Mother. Migraine Headache Daughter.  Pregnancy / Birth History Aleatha Borer, LPN; 6/56/8127 5:17 PM) Age at menarche 74 years. Gravida 3 Irregular periods Maternal age 66-20 Para 3     Review of Systems (Ammie Eversole LPN; 0/11/7492 4:96 PM) General Not Present- Appetite Loss, Chills, Fatigue, Fever,  Night Sweats, Weight Gain and Weight Loss. Skin Not Present- Change in Wart/Mole, Dryness, Hives, Jaundice, New Lesions, Non-Healing Wounds, Rash and Ulcer. HEENT Present- Sinus Pain and Wears glasses/contact lenses. Not Present- Earache, Hearing Loss, Hoarseness, Nose Bleed, Oral Ulcers, Ringing in the Ears, Seasonal Allergies, Sore Throat, Visual Disturbances and Yellow Eyes. Respiratory Not Present- Bloody sputum, Chronic Cough, Difficulty Breathing, Snoring and Wheezing. Cardiovascular Present- Swelling of Extremities. Not Present- Chest Pain, Difficulty Breathing Lying Down, Leg Cramps, Palpitations, Rapid Heart Rate and Shortness of Breath. Gastrointestinal Present- Bloating. Not Present- Abdominal Pain, Bloody Stool, Change in Bowel Habits, Chronic diarrhea, Constipation, Difficulty Swallowing, Excessive gas, Gets full quickly at meals, Hemorrhoids, Indigestion, Nausea, Rectal Pain and Vomiting. Female Genitourinary Present- Frequency. Not Present- Nocturia, Painful Urination, Pelvic Pain and Urgency. Musculoskeletal Not Present- Back Pain, Joint Pain, Joint Stiffness, Muscle Pain, Muscle Weakness and Swelling of Extremities. Neurological Present- Headaches. Not Present- Decreased Memory, Fainting, Numbness, Seizures, Tingling, Tremor, Trouble walking and Weakness. Psychiatric Present- Anxiety and Depression. Not Present- Bipolar, Change in Sleep Pattern, Fearful and Frequent crying. Endocrine Not Present- Cold Intolerance, Excessive Hunger, Hair Changes, Heat Intolerance, Hot flashes and New Diabetes. Hematology Not Present- Easy Bruising, Excessive bleeding, Gland problems, HIV and Persistent Infections.  Vitals (Ammie Eversole LPN; 7/59/1638 4:66 PM) 08/07/2015 2:11 PM Weight: 246.2 lb Height: 64in Body Surface Area: 2.25 m Body Mass Index: 42.26 kg/m Temp.: 98.22F(Oral)  Pulse: 74 (Regular)  BP: 132/78 (Sitting, Left Arm, Standard)     Physical Exam Rodman Key K. Katha Kuehne  MD; 08/07/2015 3:09 PM)  The physical exam findings are as follows: Note:WDWN in NAD HEENT: EOMI, sclera anicteric Neck: No masses, no thyromegaly Lungs: CTA bilaterally; normal respiratory effort CV: Regular rate and rhythm; no murmurs Abd: +bowel sounds, soft, non-tender, no masses Ext: Well-perfused; no edema; proximal  medial right thigh - 12 cm oval subcutaneous mass; soft, well-demarcated; no sign of inflammation Skin: Warm, dry; no sign of jaundice    Assessment & Plan Rodman Key K. Rahel Carlton MD; 08/07/2015 2:36 PM)  LIPOMA OF THIGH (D17.20) Right proximal medial thigh - 12 cm Current Plans Schedule for Surgery - Excision of subcutaneous lipoma - right thigh (12 cm). The surgical procedure has been discussed with the patient. Potential risks, benefits, alternative treatments, and expected outcomes have been explained. All of the patient's questions at this time have been answered. The likelihood of reaching the patient's treatment goal is good. The patient understand the proposed surgical procedure and wishes to proceed.   Erin Young. Georgette Dover, MD, [facility] Surgery  General/ Trauma Surgery  08/07/2015 3:10 PM

## 2015-12-17 ENCOUNTER — Other Ambulatory Visit: Payer: Self-pay | Admitting: Gastroenterology

## 2016-06-13 ENCOUNTER — Other Ambulatory Visit: Payer: Self-pay | Admitting: Physician Assistant

## 2016-06-13 DIAGNOSIS — R1013 Epigastric pain: Secondary | ICD-10-CM

## 2016-06-20 ENCOUNTER — Other Ambulatory Visit: Payer: Self-pay | Admitting: Physician Assistant

## 2016-06-20 ENCOUNTER — Ambulatory Visit
Admission: RE | Admit: 2016-06-20 | Discharge: 2016-06-20 | Disposition: A | Discharge status: Home / Self Care | Payer: Managed Care, Other (non HMO) | Source: Ambulatory Visit | Attending: Physician Assistant | Admitting: Physician Assistant

## 2016-06-20 DIAGNOSIS — R1013 Epigastric pain: Secondary | ICD-10-CM

## 2016-09-04 ENCOUNTER — Ambulatory Visit: Payer: Self-pay | Admitting: Surgery

## 2016-09-04 NOTE — H&P (Signed)
History of Present Illness Imogene Burn. Gabreille Dardis MD; 09/04/2016 12:11 PM) The patient is a 50 year old female who presents for evaluation of gall stones. PCP - Dr. Kelton Pillar  This is a 50 year old female who was referred to [place] last year by Dr. Laurann Montana for a large mass on her proximal right thigh near her groin. This was felt to represent a 12 cm subcutaneous lipoma. We recommended excision at that time but the patient decided not to have surgery because of financial reasons. The mass has enlarged slightly.  The patient also presents with a 5 year history of intermittent postprandial epigastric abdominal pain. This tends to be exacerbated by eating spicy or greasy foods. This is associated with some nausea and abdominal distention. She had a recent ultrasound that showed multiple mobile gallstones with no sign of cholecystitis. She presents now to discuss cholecystectomy  CLINICAL DATA: Epigastric abdominal pain  EXAM: US ABDOMEN LIMITED - RIGHT UPPER QUADRANT  COMPARISON: None in PACs  FINDINGS: Gallbladder:  There are multiple echogenic mobile shadowing stones measuring under 5 mm. Similar size non mobile echogenic non shadowing foci likely reflect polyps. There is no gallbladder wall thickening, pericholecystic fluid, or positive sonographic Murphy's sign.  Common bile duct:  Diameter: 4.5 mm  Liver:  The hepatic echotexture is normal. There is no focal mass nor ductal dilation.  IMPRESSION: Gallstones and polyps. No sonographic evidence of acute cholecystitis.  Normal appearance of the liver and common bile duct.   Electronically Signed By: David Martinique M.D. On: 06/20/2016 11:06   Allergies (Armen Eulas Post, CMA; 09/04/2016 10:19 AM) No Known Drug Allergies 08/07/2015  Medication History (Armen Eulas Post, CMA; 09/04/2016 10:22 AM) Metoprolol Tartrate (50MG  Tablet, Oral) Active. Hydrochlorothiazide (25MG  Tablet, Oral) Active. RaNITidine HCl (150MG  Capsule,  Oral) Active. Turmeric Curcumin (500MG  Capsule, Oral) Active. Medications Reconciled    Vitals (Armen Glenn CMA; 09/04/2016 10:19 AM) 09/04/2016 10:18 AM Weight: 245.5 lb Height: 62in Body Surface Area: 2.09 m Body Mass Index: 44.9 kg/m  Temp.: 97.50F  Pulse: 70 (Regular)  BP: 138/70 (Sitting, Left Arm, Standard)      Physical Exam Rodman Key K. Jeaneane Adamec MD; 09/04/2016 12:12 PM)  The physical exam findings are as follows: Note:WDWN in NAD Eyes: Pupils equal, round; sclera anicteric HENT: Oral mucosa moist; good dentition Neck: No masses palpated, no thyromegaly Lungs: CTA bilaterally; normal respiratory effort CV: Regular rate and rhythm; no murmurs; extremities well-perfused with no edema Abd: +bowel sounds, soft, mildly tender in epigastrium and RUQ, no palpable organomegaly; no palpable hernias Skin: Warm, dry; no sign of jaundice; proximal medial right thigh - 12 cm oval subcutaneous mass; soft, well-demarcated; no sign of inflammation Psychiatric - alert and oriented x 4; calm mood and affect    Assessment & Plan Rodman Key K. Ahmari Duerson MD; 09/04/2016 12:12 PM)  LIPOMA OF THIGH (D17.20) Impression: proximal medial right thigh - 12 cm  CHRONIC CHOLECYSTITIS WITH CALCULUS (K80.10)  Current Plans Schedule for Surgery - Laparoscopic cholecystectomy with intraoperative cholangiogram and excision of right thigh lipoma. The surgical procedure has been discussed with the patient. Potential risks, benefits, alternative treatments, and expected outcomes have been explained. All of the patient's questions at this time have been answered. The likelihood of reaching the patient's treatment goal is good. The patient understand the proposed surgical procedure and wishes to proceed.  Imogene Burn. Georgette Dover, MD, [facility] Surgery  General/ Trauma Surgery  09/04/2016 12:13 PM

## 2016-10-07 ENCOUNTER — Encounter (HOSPITAL_COMMUNITY): Payer: Self-pay

## 2016-10-07 ENCOUNTER — Encounter (HOSPITAL_COMMUNITY)
Admission: RE | Admit: 2016-10-07 | Discharge: 2016-10-07 | Disposition: A | Discharge status: Home / Self Care | Payer: Managed Care, Other (non HMO) | Source: Ambulatory Visit | Attending: Surgery | Admitting: Surgery

## 2016-10-07 DIAGNOSIS — Z01812 Encounter for preprocedural laboratory examination: Secondary | ICD-10-CM | POA: Insufficient documentation

## 2016-10-07 DIAGNOSIS — I10 Essential (primary) hypertension: Secondary | ICD-10-CM | POA: Diagnosis not present

## 2016-10-07 DIAGNOSIS — Z01818 Encounter for other preprocedural examination: Secondary | ICD-10-CM | POA: Diagnosis present

## 2016-10-07 DIAGNOSIS — D1723 Benign lipomatous neoplasm of skin and subcutaneous tissue of right leg: Secondary | ICD-10-CM | POA: Insufficient documentation

## 2016-10-07 DIAGNOSIS — K8010 Calculus of gallbladder with chronic cholecystitis without obstruction: Secondary | ICD-10-CM | POA: Diagnosis not present

## 2016-10-07 DIAGNOSIS — R001 Bradycardia, unspecified: Secondary | ICD-10-CM | POA: Insufficient documentation

## 2016-10-07 HISTORY — DX: Headache, unspecified: R51.9

## 2016-10-07 HISTORY — DX: Presence of spectacles and contact lenses: Z97.3

## 2016-10-07 HISTORY — DX: Frequency of micturition: R35.0

## 2016-10-07 HISTORY — DX: Major depressive disorder, single episode, unspecified: F32.9

## 2016-10-07 HISTORY — DX: Headache: R51

## 2016-10-07 HISTORY — DX: Depression, unspecified: F32.A

## 2016-10-07 LAB — CBC
HCT: 35.1 % — ABNORMAL LOW (ref 36.0–46.0)
Hemoglobin: 11.3 g/dL — ABNORMAL LOW (ref 12.0–15.0)
MCH: 31 pg (ref 26.0–34.0)
MCHC: 32.2 g/dL (ref 30.0–36.0)
MCV: 96.2 fL (ref 78.0–100.0)
Platelets: 308 10*3/uL (ref 150–400)
RBC: 3.65 MIL/uL — ABNORMAL LOW (ref 3.87–5.11)
RDW: 13.6 % (ref 11.5–15.5)
WBC: 5.1 10*3/uL (ref 4.0–10.5)

## 2016-10-07 LAB — BASIC METABOLIC PANEL
Anion gap: 7 (ref 5–15)
BUN: 19 mg/dL (ref 6–20)
CO2: 25 mmol/L (ref 22–32)
Calcium: 9.5 mg/dL (ref 8.9–10.3)
Chloride: 107 mmol/L (ref 101–111)
Creatinine, Ser: 0.94 mg/dL (ref 0.44–1.00)
GFR calc Af Amer: >60 mL/min (ref >60)
GFR calc non Af Amer: >60 mL/min (ref >60)
Glucose, Bld: 95 mg/dL (ref 65–99)
Potassium: 3.7 mmol/L (ref 3.5–5.1)
Sodium: 139 mmol/L (ref 135–145)

## 2016-10-07 LAB — HCG, SERUM, QUALITATIVE: Preg, Serum: NEGATIVE

## 2016-10-07 NOTE — Progress Notes (Signed)
PCP - Dr. Lavone Orn at [facility] - denies  EKG - 10/07/16 CXR - denies  Echo- denies Stress test - pt. States it was greater than 5 years ago  Cardiac Cath - 2013  Patient denies chest pain and shortness of breath at PAT appointment.

## 2016-10-07 NOTE — Pre-Procedure Instructions (Signed)
Erin Young  10/07/2016      [facility] , [address] [place] [address] Phone: [phone] Fax: [phone]  [place], [place] [address] Phone: [phone] Fax: [phone]    Your procedure is scheduled on Tuesday, November 28th, 2017.  Report to [facility] Admitting at 5:30 A.M.   Call this number if you have problems the morning of surgery:  [phone]   Remember:  Do not eat food or drink liquids after midnight.   Take these medicines the morning of surgery with A SIP OF WATER: Acetaminophen (Tylenol) if needed, Metoprolol (Lopressor), Ranitidine (Zantac), Sertraline (Zoloft).    Stop taking: Aspirin-acetaminophen-caffeine (Excedrin Migraine), Aspirin, NSAIDS, Aleve, Naproxen, Ibuprofen, Advil, Motrin, BC's, Goody's, Fish oil, all herbal medications, and all vitamins.     Do not wear jewelry, make-up or nail polish.  Do not wear lotions, powders, or perfumes, or deoderant.  Do not shave 48 hours prior to surgery.    Do not bring valuables to the hospital.  [facility] is not responsible for any belongings or valuables.  Contacts, dentures or bridgework may not be worn into surgery.  Leave your suitcase in the car.  After surgery it may be brought to your room.  For patients admitted to the hospital, discharge time will be determined by your treatment team.  Patients discharged the day of surgery will not be allowed to drive home.   Special instructions:  Preparing for Surgery.   [place]- Preparing For Surgery  Before surgery, you can play an important role. Because skin is not sterile, your skin needs to be as free of germs as possible. You can reduce the number of germs on your skin by washing with CHG (chlorahexidine gluconate) Soap before surgery.  CHG is an antiseptic cleaner which kills  germs and bonds with the skin to continue killing germs even after washing.  Please do not use if you have an allergy to CHG or antibacterial soaps. If your skin becomes reddened/irritated stop using the CHG.  Do not shave (including legs and underarms) for at least 48 hours prior to first CHG shower. It is OK to shave your face.  Please follow these instructions carefully.   1. Shower the NIGHT BEFORE SURGERY and the MORNING OF SURGERY with CHG.   2. If you chose to wash your hair, wash your hair first as usual with your normal shampoo.  3. After you shampoo, rinse your hair and body thoroughly to remove the shampoo.  4. Use CHG as you would any other liquid soap. You can apply CHG directly to the skin and wash gently with a scrungie or a clean washcloth.   5. Apply the CHG Soap to your body ONLY FROM THE NECK DOWN.  Do not use on open wounds or open sores. Avoid contact with your eyes, ears, mouth and genitals (private parts). Wash genitals (private parts) with your normal soap.  6. Wash thoroughly, paying special attention to the area where your surgery will be performed.  7. Thoroughly rinse your body with warm water from the neck down.  8. DO NOT shower/wash with your normal soap after using and rinsing off the CHG Soap.  9. Pat yourself dry with a CLEAN TOWEL.   10. Wear CLEAN PAJAMAS   11. Place CLEAN SHEETS on your bed the night of your  first shower and DO NOT SLEEP WITH PETS.  Day of Surgery: Do not apply any deodorants/lotions. Please wear clean clothes to the hospital/surgery center.     Please read over the following fact sheets that you were given.

## 2016-10-13 MED ORDER — DEXTROSE 5 % IV SOLN
3.0000 g | INTRAVENOUS | Status: AC
  Administered 2016-10-14: 3 g via INTRAVENOUS
  Filled 2016-10-13: qty 3000

## 2016-10-14 ENCOUNTER — Ambulatory Visit (HOSPITAL_COMMUNITY): Payer: Managed Care, Other (non HMO) | Admitting: Certified Registered"

## 2016-10-14 ENCOUNTER — Ambulatory Visit (HOSPITAL_COMMUNITY): Payer: Managed Care, Other (non HMO)

## 2016-10-14 ENCOUNTER — Ambulatory Visit (HOSPITAL_COMMUNITY)
Admission: RE | Admit: 2016-10-14 | Discharge: 2016-10-14 | Disposition: A | Discharge status: Home / Self Care | Payer: Managed Care, Other (non HMO) | Source: Ambulatory Visit | Attending: Surgery | Admitting: Surgery

## 2016-10-14 ENCOUNTER — Encounter (HOSPITAL_COMMUNITY): Payer: Self-pay | Admitting: Certified Registered"

## 2016-10-14 ENCOUNTER — Encounter (HOSPITAL_COMMUNITY)
Admission: RE | Disposition: A | Discharge status: Home / Self Care | Payer: Self-pay | Source: Ambulatory Visit | Attending: Surgery

## 2016-10-14 DIAGNOSIS — K219 Gastro-esophageal reflux disease without esophagitis: Secondary | ICD-10-CM | POA: Diagnosis not present

## 2016-10-14 DIAGNOSIS — Z6841 Body Mass Index (BMI) 40.0 and over, adult: Secondary | ICD-10-CM | POA: Insufficient documentation

## 2016-10-14 DIAGNOSIS — D1723 Benign lipomatous neoplasm of skin and subcutaneous tissue of right leg: Secondary | ICD-10-CM | POA: Insufficient documentation

## 2016-10-14 DIAGNOSIS — I209 Angina pectoris, unspecified: Secondary | ICD-10-CM | POA: Diagnosis not present

## 2016-10-14 DIAGNOSIS — F329 Major depressive disorder, single episode, unspecified: Secondary | ICD-10-CM | POA: Diagnosis not present

## 2016-10-14 DIAGNOSIS — K8010 Calculus of gallbladder with chronic cholecystitis without obstruction: Secondary | ICD-10-CM | POA: Insufficient documentation

## 2016-10-14 DIAGNOSIS — I10 Essential (primary) hypertension: Secondary | ICD-10-CM | POA: Insufficient documentation

## 2016-10-14 DIAGNOSIS — Z419 Encounter for procedure for purposes other than remedying health state, unspecified: Secondary | ICD-10-CM

## 2016-10-14 HISTORY — PX: LIPOMA EXCISION: SHX5283

## 2016-10-14 HISTORY — PX: CHOLECYSTECTOMY: SHX55

## 2016-10-14 SURGERY — LAPAROSCOPIC CHOLECYSTECTOMY WITH INTRAOPERATIVE CHOLANGIOGRAM
Anesthesia: General | Site: Leg Upper | Laterality: Right

## 2016-10-14 MED ORDER — LACTATED RINGERS IV SOLN
INTRAVENOUS | Status: DC | PRN
  Administered 2016-10-14 (×2): via INTRAVENOUS

## 2016-10-14 MED ORDER — PHENYLEPHRINE 40 MCG/ML (10ML) SYRINGE FOR IV PUSH (FOR BLOOD PRESSURE SUPPORT)
PREFILLED_SYRINGE | INTRAVENOUS | Status: AC
  Filled 2016-10-14: qty 10

## 2016-10-14 MED ORDER — EPHEDRINE SULFATE 50 MG/ML IJ SOLN
INTRAMUSCULAR | Status: DC | PRN
  Administered 2016-10-14 (×5): 10 mg via INTRAVENOUS

## 2016-10-14 MED ORDER — ROCURONIUM BROMIDE 10 MG/ML (PF) SYRINGE
PREFILLED_SYRINGE | INTRAVENOUS | Status: DC | PRN
  Administered 2016-10-14: 60 mg via INTRAVENOUS

## 2016-10-14 MED ORDER — PHENYLEPHRINE 40 MCG/ML (10ML) SYRINGE FOR IV PUSH (FOR BLOOD PRESSURE SUPPORT)
PREFILLED_SYRINGE | INTRAVENOUS | Status: DC | PRN
Start: 2016-10-14 — End: 2016-10-14
  Administered 2016-10-14 (×2): 80 ug via INTRAVENOUS

## 2016-10-14 MED ORDER — BUPIVACAINE HCL (PF) 0.25 % IJ SOLN
INTRAMUSCULAR | Status: DC | PRN
Start: 2016-10-14 — End: 2016-10-14
  Administered 2016-10-14: 20 mL

## 2016-10-14 MED ORDER — PROPOFOL 10 MG/ML IV BOLUS
INTRAVENOUS | Status: AC
  Filled 2016-10-14: qty 20

## 2016-10-14 MED ORDER — DEXAMETHASONE SODIUM PHOSPHATE 10 MG/ML IJ SOLN
INTRAMUSCULAR | Status: AC
  Filled 2016-10-14: qty 1

## 2016-10-14 MED ORDER — CHLORHEXIDINE GLUCONATE CLOTH 2 % EX PADS: 6.0000 | MEDICATED_PAD | Freq: Once | CUTANEOUS | Status: DC

## 2016-10-14 MED ORDER — OXYCODONE HCL 5 MG PO TABS: 5.0000 mg | ORAL_TABLET | Freq: Once | ORAL | Status: DC | PRN

## 2016-10-14 MED ORDER — SODIUM CHLORIDE 0.9 % IR SOLN
Status: DC | PRN
Start: 2016-10-14 — End: 2016-10-14
  Administered 2016-10-14: 1000 mL

## 2016-10-14 MED ORDER — BUPIVACAINE HCL (PF) 0.25 % IJ SOLN
INTRAMUSCULAR | Status: AC
  Filled 2016-10-14: qty 60

## 2016-10-14 MED ORDER — DIPHENHYDRAMINE HCL 50 MG/ML IJ SOLN
INTRAMUSCULAR | Status: AC
  Filled 2016-10-14: qty 1

## 2016-10-14 MED ORDER — FENTANYL CITRATE (PF) 100 MCG/2ML IJ SOLN
INTRAMUSCULAR | Status: DC | PRN
  Administered 2016-10-14: 100 ug via INTRAVENOUS

## 2016-10-14 MED ORDER — FENTANYL CITRATE (PF) 100 MCG/2ML IJ SOLN: 25.0000 ug | INTRAMUSCULAR | Status: DC | PRN

## 2016-10-14 MED ORDER — 0.9 % SODIUM CHLORIDE (POUR BTL) OPTIME
TOPICAL | Status: DC | PRN
  Administered 2016-10-14: 1000 mL

## 2016-10-14 MED ORDER — OXYCODONE-ACETAMINOPHEN 5-325 MG PO TABS: 1.0000 | ORAL_TABLET | ORAL | 0 refills | Status: DC | PRN

## 2016-10-14 MED ORDER — LIDOCAINE 2% (20 MG/ML) 5 ML SYRINGE
INTRAMUSCULAR | Status: DC | PRN
  Administered 2016-10-14: 60 mg via INTRAVENOUS

## 2016-10-14 MED ORDER — IOPAMIDOL (ISOVUE-300) INJECTION 61%
INTRAVENOUS | Status: AC
  Filled 2016-10-14: qty 50

## 2016-10-14 MED ORDER — ONDANSETRON HCL 4 MG/2ML IJ SOLN
INTRAMUSCULAR | Status: DC | PRN
  Administered 2016-10-14: 4 mg via INTRAVENOUS

## 2016-10-14 MED ORDER — OXYCODONE HCL 5 MG/5ML PO SOLN: 5.0000 mg | Freq: Once | ORAL | Status: DC | PRN

## 2016-10-14 MED ORDER — ONDANSETRON HCL 4 MG/2ML IJ SOLN
INTRAMUSCULAR | Status: AC
  Filled 2016-10-14: qty 2

## 2016-10-14 MED ORDER — MORPHINE SULFATE (PF) 2 MG/ML IV SOLN: 2.0000 mg | INTRAVENOUS | Status: DC | PRN

## 2016-10-14 MED ORDER — DEXAMETHASONE SODIUM PHOSPHATE 10 MG/ML IJ SOLN
INTRAMUSCULAR | Status: DC | PRN
  Administered 2016-10-14: 4 mg via INTRAVENOUS

## 2016-10-14 MED ORDER — SODIUM CHLORIDE 0.9 % IV SOLN
INTRAVENOUS | Status: DC | PRN
  Administered 2016-10-14: 7 mL

## 2016-10-14 MED ORDER — OXYCODONE-ACETAMINOPHEN 5-325 MG PO TABS: 1.0000 | ORAL_TABLET | ORAL | Status: DC | PRN

## 2016-10-14 MED ORDER — ONDANSETRON HCL 4 MG/2ML IJ SOLN
4.0000 mg | INTRAMUSCULAR | Status: DC | PRN
  Filled 2016-10-14: qty 2

## 2016-10-14 MED ORDER — FENTANYL CITRATE (PF) 100 MCG/2ML IJ SOLN
INTRAMUSCULAR | Status: AC
  Filled 2016-10-14: qty 4

## 2016-10-14 MED ORDER — EPHEDRINE 5 MG/ML INJ
INTRAVENOUS | Status: AC
  Filled 2016-10-14: qty 10

## 2016-10-14 MED ORDER — SUGAMMADEX SODIUM 200 MG/2ML IV SOLN
INTRAVENOUS | Status: DC | PRN
  Administered 2016-10-14: 200 mg via INTRAVENOUS

## 2016-10-14 MED ORDER — SUGAMMADEX SODIUM 200 MG/2ML IV SOLN
INTRAVENOUS | Status: AC
  Filled 2016-10-14: qty 2

## 2016-10-14 MED ORDER — DIPHENHYDRAMINE HCL 50 MG/ML IJ SOLN
INTRAMUSCULAR | Status: DC | PRN
  Administered 2016-10-14: 10 mg via INTRAVENOUS

## 2016-10-14 MED ORDER — MIDAZOLAM HCL 5 MG/5ML IJ SOLN
INTRAMUSCULAR | Status: DC | PRN
  Administered 2016-10-14: 2 mg via INTRAVENOUS

## 2016-10-14 MED ORDER — PROPOFOL 10 MG/ML IV BOLUS
INTRAVENOUS | Status: DC | PRN
  Administered 2016-10-14: 140 mg via INTRAVENOUS

## 2016-10-14 MED ORDER — MIDAZOLAM HCL 2 MG/2ML IJ SOLN
INTRAMUSCULAR | Status: AC
  Filled 2016-10-14: qty 2

## 2016-10-14 SURGICAL SUPPLY — 62 items
APPLIER CLIP ROT 10 11.4 M/L (STAPLE) ×3
BENZOIN TINCTURE PRP APPL 2/3 (GAUZE/BANDAGES/DRESSINGS) ×6 IMPLANT
BLADE SURG 10 STRL SS (BLADE) ×3 IMPLANT
BLADE SURG 15 STRL LF DISP TIS (BLADE) ×2 IMPLANT
BLADE SURG 15 STRL SS (BLADE) ×1
BLADE SURG ROTATE 9660 (MISCELLANEOUS) IMPLANT
CANISTER SUCTION 2500CC (MISCELLANEOUS) ×3 IMPLANT
CHLORAPREP W/TINT 26ML (MISCELLANEOUS) ×3 IMPLANT
CLIP APPLIE ROT 10 11.4 M/L (STAPLE) ×2 IMPLANT
COVER MAYO STAND STRL (DRAPES) ×6 IMPLANT
COVER SURGICAL LIGHT HANDLE (MISCELLANEOUS) ×3 IMPLANT
DRAPE C-ARM 42X72 X-RAY (DRAPES) ×3 IMPLANT
DRAPE LAPAROTOMY 100X72 PEDS (DRAPES) ×3 IMPLANT
DRAPE UTILITY XL STRL (DRAPES) ×6 IMPLANT
DRSG TEGADERM 2-3/8X2-3/4 SM (GAUZE/BANDAGES/DRESSINGS) ×9 IMPLANT
DRSG TEGADERM 4X4.75 (GAUZE/BANDAGES/DRESSINGS) ×6 IMPLANT
ELECT CAUTERY BLADE 6.4 (BLADE) ×3 IMPLANT
ELECT REM PT RETURN 9FT ADLT (ELECTROSURGICAL) ×3
ELECTRODE REM PT RTRN 9FT ADLT (ELECTROSURGICAL) ×2 IMPLANT
FILTER SMOKE EVAC LAPAROSHD (FILTER) ×3 IMPLANT
GAUZE SPONGE 2X2 8PLY STRL LF (GAUZE/BANDAGES/DRESSINGS) ×2 IMPLANT
GAUZE SPONGE 4X4 12PLY STRL (GAUZE/BANDAGES/DRESSINGS) ×3 IMPLANT
GAUZE SPONGE 4X4 16PLY XRAY LF (GAUZE/BANDAGES/DRESSINGS) ×6 IMPLANT
GLOVE BIO SURGEON STRL SZ7 (GLOVE) ×9 IMPLANT
GLOVE BIOGEL PI IND STRL 7.5 (GLOVE) ×2 IMPLANT
GLOVE BIOGEL PI INDICATOR 7.5 (GLOVE) ×1
GLOVE ECLIPSE 7.0 STRL STRAW (GLOVE) ×3 IMPLANT
GLOVE INDICATOR 7.0 STRL GRN (GLOVE) ×9 IMPLANT
GLOVE INDICATOR 7.5 STRL GRN (GLOVE) ×3 IMPLANT
GOWN STRL REUS W/ TWL LRG LVL3 (GOWN DISPOSABLE) ×10 IMPLANT
GOWN STRL REUS W/TWL LRG LVL3 (GOWN DISPOSABLE) ×5
KIT BASIN OR (CUSTOM PROCEDURE TRAY) ×3 IMPLANT
KIT ROOM TURNOVER OR (KITS) ×3 IMPLANT
LEGGING LITHOTOMY PAIR STRL (DRAPES) ×3 IMPLANT
NEEDLE HYPO 25GX1X1/2 BEV (NEEDLE) ×6 IMPLANT
NS IRRIG 1000ML POUR BTL (IV SOLUTION) ×3 IMPLANT
PACK SURGICAL SETUP 50X90 (CUSTOM PROCEDURE TRAY) ×3 IMPLANT
PAD ARMBOARD 7.5X6 YLW CONV (MISCELLANEOUS) ×3 IMPLANT
PENCIL BUTTON HOLSTER BLD 10FT (ELECTRODE) ×3 IMPLANT
POUCH SPECIMEN RETRIEVAL 10MM (ENDOMECHANICALS) ×3 IMPLANT
SCISSORS LAP 5X35 DISP (ENDOMECHANICALS) ×3 IMPLANT
SET CHOLANGIOGRAPH 5 50 .035 (SET/KITS/TRAYS/PACK) ×3 IMPLANT
SET IRRIG TUBING LAPAROSCOPIC (IRRIGATION / IRRIGATOR) ×3 IMPLANT
SLEEVE ENDOPATH XCEL 5M (ENDOMECHANICALS) ×3 IMPLANT
SPECIMEN JAR SMALL (MISCELLANEOUS) ×3 IMPLANT
SPONGE GAUZE 2X2 STER 10/PKG (GAUZE/BANDAGES/DRESSINGS) ×1
SPONGE LAP 18X18 X RAY DECT (DISPOSABLE) ×3 IMPLANT
STRIP CLOSURE SKIN 1/2X4 (GAUZE/BANDAGES/DRESSINGS) ×6 IMPLANT
SUT MNCRL AB 4-0 PS2 18 (SUTURE) ×9 IMPLANT
SUT VIC AB 2-0 SH 27 (SUTURE)
SUT VIC AB 2-0 SH 27X BRD (SUTURE) IMPLANT
SUT VIC AB 3-0 SH 27 (SUTURE) ×1
SUT VIC AB 3-0 SH 27XBRD (SUTURE) ×2 IMPLANT
SYR BULB 3OZ (MISCELLANEOUS) ×3 IMPLANT
SYR CONTROL 10ML LL (SYRINGE) ×6 IMPLANT
TOWEL OR 17X24 6PK STRL BLUE (TOWEL DISPOSABLE) ×3 IMPLANT
TOWEL OR 17X26 10 PK STRL BLUE (TOWEL DISPOSABLE) IMPLANT
TRAY LAPAROSCOPIC MC (CUSTOM PROCEDURE TRAY) ×3 IMPLANT
TROCAR XCEL BLUNT TIP 100MML (ENDOMECHANICALS) ×3 IMPLANT
TROCAR XCEL NON-BLD 11X100MML (ENDOMECHANICALS) ×3 IMPLANT
TROCAR XCEL NON-BLD 5MMX100MML (ENDOMECHANICALS) ×3 IMPLANT
TUBING INSUFFLATION (TUBING) ×3 IMPLANT

## 2016-10-14 NOTE — Transfer of Care (Signed)
Immediate Anesthesia Transfer of Care Note  Patient: Erin Young  Procedure(s) Performed: Procedure(s): LAPAROSCOPIC CHOLECYSTECTOMY WITH INTRAOPERATIVE CHOLANGIOGRAM (N/A) EXCISION SUBCUTANEOUS  LIPOMA RIGHT THIGH (Right)  Patient Location: PACU  Anesthesia Type:General  Level of Consciousness: awake, oriented and patient cooperative  Airway & Oxygen Therapy: Patient Spontanous Breathing and Patient connected to nasal cannula oxygen  Post-op Assessment: Report given to RN, Post -op Vital signs reviewed and stable and Patient moving all extremities  Post vital signs: Reviewed and stable  Last Vitals:  Vitals:   10/14/16 0605 10/14/16 0918  BP: 129/84 116/80  Pulse: (!) 59 82  Resp: 20 20  Temp: 36.9 C 36.3 C    Last Pain:  Vitals:   10/14/16 0918  TempSrc:   PainSc: (P) 0-No pain      Patients Stated Pain Goal: 2 ([id number])  Complications: No apparent anesthesia complications

## 2016-10-14 NOTE — Op Note (Signed)
Laparoscopic Cholecystectomy with IOC/ excision of right medial thigh lipoma Procedure Note  Indications: This patient presents with symptomatic gallbladder disease and will undergo laparoscopic cholecystectomy.  She also has a large lipoma on the proximal medial right thigh that is enlarging.  We will excise this as well.  Pre-operative Diagnosis: Calculus of gallbladder with other cholecystitis, without mention of obstruction     Subcutaneous lipoma - right thigh 12 cm Post-operative Diagnosis: Same  Surgeon: Rabon Scholle K.   Assistants: none  Anesthesia: General endotracheal anesthesia  ASA Class: 1  Procedure Details  The patient was seen again in the Holding Room. The risks, benefits, complications, treatment options, and expected outcomes were discussed with the patient. The possibilities of reaction to medication, pulmonary aspiration, perforation of viscus, bleeding, recurrent infection, finding a normal gallbladder, the need for additional procedures, failure to diagnose a condition, the possible need to convert to an open procedure, and creating a complication requiring transfusion or operation were discussed with the patient. The likelihood of improving the patient's symptoms with return to their baseline status is good.  The patient and/or family concurred with the proposed plan, giving informed consent. The site of surgery properly noted. The patient was taken to Operating Room, identified as Graice Raggio and the procedure verified as Laparoscopic Cholecystectomy with Intraoperative Cholangiogram. A Time Out was held and the above information confirmed.  Prior to the induction of general anesthesia, antibiotic prophylaxis was administered. General endotracheal anesthesia was then administered and tolerated well. After the induction, the abdomen was prepped with Chloraprep and draped in the sterile fashion. The patient was positioned in the supine position.  Local anesthetic agent  was injected into the skin below the umbilicus and an incision made. We dissected down to the abdominal fascia with blunt dissection.  The fascia was incised vertically and we entered the peritoneal cavity bluntly.  A pursestring suture of 0-Vicryl was placed around the fascial opening.  The Hasson cannula was inserted and secured with the stay suture.  Pneumoperitoneum was then created with CO2 and tolerated well without any adverse changes in the patient's vital signs. An 11-mm port was placed in the subxiphoid position.  Two 5-mm ports were placed in the right upper quadrant. All skin incisions were infiltrated with a local anesthetic agent before making the incision and placing the trocars.   We positioned the patient in reverse Trendelenburg, tilted slightly to the patient's left.  The gallbladder was identified, the fundus grasped and retracted cephalad. Adhesions were lysed bluntly and with the electrocautery where indicated, taking care not to injure any adjacent organs or viscus. The infundibulum was grasped and retracted laterally, exposing the peritoneum overlying the triangle of Calot. This was then divided and exposed in a blunt fashion. A critical view of the cystic duct and cystic artery was obtained.  The cystic duct was clearly identified and bluntly dissected circumferentially. The cystic duct was ligated with a clip distally.   An incision was made in the cystic duct and the [facility] cholangiogram catheter introduced. The catheter was secured using a clip. A cholangiogram was then obtained which showed good visualization of the distal and proximal biliary tree with no sign of filling defects or obstruction.  Contrast flowed easily into the duodenum. The catheter was then removed.   The cystic duct was then ligated with clips and divided. The cystic artery was identified, dissected free, ligated with clips and divided as well.   The gallbladder was dissected from the liver bed in retrograde  fashion with the electrocautery. The gallbladder was removed and placed in an Endocatch sac. The liver bed was irrigated and inspected. Hemostasis was achieved with the electrocautery. Copious irrigation was utilized and was repeatedly aspirated until clear.  The gallbladder and Endocatch sac were then removed through the umbilical port site.  The pursestring suture was used to close the umbilical fascia.    We again inspected the right upper quadrant for hemostasis.  Pneumoperitoneum was released as we removed the trocars.  4-0 Monocryl was used to close the skin.   Benzoin, steri-strips, and clean dressings were applied.   We then removed the drapes and positioned the patient in lithotomy in yellowfin stirrups.  Her right thigh was prepped with Betadine and draped in sterile fashion.  I made a 4 cm incision over the mass.  We dissected through the dermis and encountered the surface of the large lipoma.  I was able to bluntly dissect around the entire 12 cm mass and removed it entirely intact.  There is a small 3 cm lipoma just inferior to the larger mass.  This was also excised.  The wound was inspected for hemostasis.  We closed with 3-0 Vicryl, 4-0 Monocryl, benzoin and steri-strips.  A dry dressing was applied.  The patient was then extubated and brought to the recovery room in stable condition. Instrument, sponge, and needle counts were correct at closure and at the conclusion of the case.   Findings: Cholecystitis with Cholelithiasis; subcutaneous lipoma right thigh  Estimated Blood Loss: Minimal         Drains: none         Specimens: Gallbladder/ right thigh lipoma       Complications: None; patient tolerated the procedure well.         Disposition: PACU - hemodynamically stable.         Condition: stable   Imogene Burn. Georgette Dover, MD, [facility]  General/ Trauma Surgery  10/14/2016 9:15 AM

## 2016-10-14 NOTE — Anesthesia Preprocedure Evaluation (Addendum)
Anesthesia Evaluation  Patient identified by MRN, date of birth, ID band Patient awake    Reviewed: Allergy & Precautions, NPO status , Patient's Chart, lab work & pertinent test results, reviewed documented beta blocker date and time   History of Anesthesia Complications Negative for: history of anesthetic complications  Airway Mallampati: II  TM Distance: >3 FB Neck ROM: Full    Dental  (+) Edentulous Upper, Teeth Intact, Dental Advisory Given   Pulmonary neg pulmonary ROS,    breath sounds clear to auscultation       Cardiovascular hypertension, Pt. on medications and Pt. on home beta blockers + angina  Rhythm:Regular     Neuro/Psych  Headaches, PSYCHIATRIC DISORDERS Depression    GI/Hepatic Neg liver ROS, GERD  Medicated and Controlled,  Endo/Other  Morbid obesity  Renal/GU negative Renal ROS     Musculoskeletal negative musculoskeletal ROS (+)   Abdominal   Peds  Hematology negative hematology ROS (+)   Anesthesia Other Findings   Reproductive/Obstetrics                            Anesthesia Physical Anesthesia Plan  ASA: III  Anesthesia Plan: General   Post-op Pain Management:    Induction: Intravenous  Airway Management Planned: Oral ETT  Additional Equipment: None  Intra-op Plan:   Post-operative Plan: Extubation in OR  Informed Consent: I have reviewed the patients History and Physical, chart, labs and discussed the procedure including the risks, benefits and alternatives for the proposed anesthesia with the patient or authorized representative who has indicated his/her understanding and acceptance.   Dental advisory given  Plan Discussed with: CRNA, Anesthesiologist and Surgeon  Anesthesia Plan Comments:        Anesthesia Quick Evaluation

## 2016-10-14 NOTE — Discharge Instructions (Signed)
[facility] °LAPAROSCOPIC SURGERY: POST OP INSTRUCTIONS °Always review your discharge instruction sheet given to you by the facility where your surgery was performed. °IF YOU HAVE DISABILITY OR FAMILY LEAVE FORMS, YOU MUST BRING THEM TO THE OFFICE FOR PROCESSING.   °DO NOT GIVE THEM TO YOUR DOCTOR. ° °1. A prescription for pain medication will be given to you upon discharge.  Take your pain medication as prescribed, if needed.  If narcotic pain medicine is not needed, then you may take acetaminophen (Tylenol) or ibuprofen (Advil) as needed. °2. Take your usually prescribed medications unless otherwise directed. °3. If you need a refill on your pain medication, please contact your pharmacy.  They will contact our office to request authorization. Prescriptions will not be filled after 5pm or on week-ends. °4. You should follow a light diet the first few days after arrival home, such as soup and crackers, etc.  Be sure to include lots of fluids daily. °5. Most patients will experience some swelling and bruising in the area of the incisions.  Ice packs will help.  Swelling and bruising can take several days to resolve.  °6. It is common to experience some constipation if taking pain medication after surgery.  Increasing fluid intake and taking a stool softener (such as Colace) will usually help or prevent this problem from occurring.  A mild laxative (Milk of Magnesia or Miralax) should be taken according to package instructions if there are no bowel movements after 48 hours. °7. Unless discharge instructions indicate otherwise, you may remove your bandages 48 hours after surgery, and you may shower at that time.  You will have steri-strips (small skin tapes) in place directly over the incision.  These strips should be left on the skin for 7-10 days.  If your surgeon used skin glue on the incision, you may shower in 24 hours.  The glue will flake off over the next 2-3 weeks.  Any sutures or staples  will be removed at the office during your follow-up visit. °8. ACTIVITIES:  You may resume regular (light) daily activities beginning the next day--such as daily self-care, walking, climbing stairs--gradually increasing activities as tolerated.  You may have sexual intercourse when it is comfortable.  Refrain from any heavy lifting or straining until approved by your doctor. °a. You may drive when you are no longer taking prescription pain medication, you can comfortably wear a seatbelt, and you can safely maneuver your car and apply brakes. °b. RETURN TO WORK:   2-3 weeks °9. You should see your doctor in the office for a follow-up appointment approximately 2-3 weeks after your surgery.  Make sure that you call for this appointment within a day or two after you arrive home to insure a convenient appointment time. °10. OTHER INSTRUCTIONS: ________________________________________________________________________ °WHEN TO CALL YOUR DOCTOR: °1. Fever over 101.0 °2. Inability to urinate °3. Continued bleeding from incision. °4. Increased pain, redness, or drainage from the incision. °5. Increasing abdominal pain ° °The clinic staff is available to answer your questions during regular business hours.  Please don’t hesitate to call and ask to speak to one of the nurses for clinical concerns.  If you have a medical emergency, go to the nearest emergency room or call 911.  A surgeon from [facility] is always on call at the hospital. °[address], [address], [place], [place]  [address] ? [address] [address], [place], [place]   [address] °[phone] ? [phone] ? FAX [phone] °Web site:   [email or web address] ° °

## 2016-10-14 NOTE — H&P (Signed)
History of Present Illness The patient is a 50 year old female who presents for evaluation of gall stones. PCP - Dr. Kelton Pillar  This is a 50 year old female who was referred to [place] last year by Dr. Laurann Montana for a large mass on her proximal right thigh near her groin. This was felt to represent a 12 cm subcutaneous lipoma. We recommended excision at that time but the patient decided not to have surgery because of financial reasons. The mass has enlarged slightly.  The patient also presents with a 5 year history of intermittent postprandial epigastric abdominal pain. This tends to be exacerbated by eating spicy or greasy foods. This is associated with some nausea and abdominal distention. She had a recent ultrasound that showed multiple mobile gallstones with no sign of cholecystitis. She presents now to discuss cholecystectomy  CLINICAL DATA: Epigastric abdominal pain  EXAM: US ABDOMEN LIMITED - RIGHT UPPER QUADRANT  COMPARISON: None in PACs  FINDINGS: Gallbladder:  There are multiple echogenic mobile shadowing stones measuring under 5 mm. Similar size non mobile echogenic non shadowing foci likely reflect polyps. There is no gallbladder wall thickening, pericholecystic fluid, or positive sonographic Murphy's sign.  Common bile duct:  Diameter: 4.5 mm  Liver:  The hepatic echotexture is normal. There is no focal mass nor ductal dilation.  IMPRESSION: Gallstones and polyps. No sonographic evidence of acute cholecystitis.  Normal appearance of the liver and common bile duct.   Electronically Signed By: David Martinique M.D. On: 06/20/2016 11:06   Allergies  No Known Drug Allergies 08/07/2015  Medication History Metoprolol Tartrate (50MG  Tablet, Oral) Active. Hydrochlorothiazide (25MG  Tablet, Oral) Active. RaNITidine HCl (150MG  Capsule, Oral) Active. Turmeric Curcumin (500MG  Capsule, Oral) Active. Medications  Reconciled    Vitals  Weight: 245.5 lb Height: 62in Body Surface Area: 2.09 m Body Mass Index: 44.9 kg/m  Temp.: 97.40F  Pulse: 70 (Regular)  BP: 138/70 (Sitting, Left Arm, Standard)      Physical Exam   The physical exam findings are as follows: Note:WDWN in NAD Eyes: Pupils equal, round; sclera anicteric HENT: Oral mucosa moist; good dentition Neck: No masses palpated, no thyromegaly Lungs: CTA bilaterally; normal respiratory effort CV: Regular rate and rhythm; no murmurs; extremities well-perfused with no edema Abd: +bowel sounds, soft, mildly tender in epigastrium and RUQ, no palpable organomegaly; no palpable hernias Skin: Warm, dry; no sign of jaundice; proximal medial right thigh - 12 cm oval subcutaneous mass; soft, well-demarcated; no sign of inflammation Psychiatric - alert and oriented x 4; calm mood and affect    Assessment & Plan   LIPOMA OF THIGH (D17.20) Impression: proximal medial right thigh - 12 cm  CHRONIC CHOLECYSTITIS WITH CALCULUS (K80.10)  Current Plans Schedule for Surgery - Laparoscopic cholecystectomy with intraoperative cholangiogram and excision of right thigh lipoma. The surgical procedure has been discussed with the patient. Potential risks, benefits, alternative treatments, and expected outcomes have been explained. All of the patient's questions at this time have been answered. The likelihood of reaching the patient's treatment goal is good. The patient understand the proposed surgical procedure and wishes to proceed.  Imogene Burn. Georgette Dover, MD, [facility]  General/ Trauma Surgery  10/14/2016 7:20 AM

## 2016-10-14 NOTE — Anesthesia Procedure Notes (Signed)
Procedure Name: Intubation Date/Time: 10/14/2016 7:37 AM Performed by: Melina Copa, Nikol Lemar R Pre-anesthesia Checklist: Patient identified, Emergency Drugs available, Suction available and Patient being monitored Patient Re-evaluated:Patient Re-evaluated prior to inductionOxygen Delivery Method: Circle System Utilized Preoxygenation: Pre-oxygenation with 100% oxygen Intubation Type: IV induction Ventilation: Mask ventilation without difficulty Laryngoscope Size: Mac and 3 Grade View: Grade I Tube type: Oral Tube size: 7.0 mm Number of attempts: 1 Airway Equipment and Method: Stylet Placement Confirmation: ETT inserted through vocal cords under direct vision,  positive ETCO2 and breath sounds checked- equal and bilateral Secured at: 21 cm Tube secured with: Tape Dental Injury: Teeth and Oropharynx as per pre-operative assessment

## 2016-10-15 ENCOUNTER — Encounter (HOSPITAL_COMMUNITY): Payer: Self-pay | Admitting: Surgery

## 2016-10-15 NOTE — Anesthesia Postprocedure Evaluation (Signed)
Anesthesia Post Note  Patient: Gelina Henninger  Procedure(s) Performed: Procedure(s) (LRB): LAPAROSCOPIC CHOLECYSTECTOMY WITH INTRAOPERATIVE CHOLANGIOGRAM (N/A) EXCISION SUBCUTANEOUS  LIPOMA RIGHT THIGH (Right)  Patient location during evaluation: PACU Anesthesia Type: General Level of consciousness: awake Pain management: pain level controlled Vital Signs Assessment: post-procedure vital signs reviewed and stable Respiratory status: spontaneous breathing Cardiovascular status: stable Postop Assessment: no signs of nausea or vomiting Anesthetic complications: no    Last Vitals:  Vitals:   10/14/16 1032 10/14/16 1040  BP:  131/82  Pulse: 76 72  Resp: 16 16  Temp: 36.1 C     Last Pain:  Vitals:   10/14/16 1040  TempSrc:   PainSc: 0-No pain                 Loredana Medellin

## 2017-09-23 ENCOUNTER — Ambulatory Visit: Payer: Commercial Managed Care - PPO | Admitting: Certified Nurse Midwife

## 2017-09-23 ENCOUNTER — Encounter: Payer: Self-pay | Admitting: Certified Nurse Midwife

## 2017-09-23 ENCOUNTER — Other Ambulatory Visit (HOSPITAL_COMMUNITY)
Admission: RE | Admit: 2017-09-23 | Discharge: 2017-09-23 | Disposition: A | Discharge status: Home / Self Care | Payer: Commercial Managed Care - PPO | Source: Ambulatory Visit | Attending: Certified Nurse Midwife | Admitting: Certified Nurse Midwife

## 2017-09-23 VITALS — BP 126/72 | HR 70 | Resp 16 | Ht 62.75 in | Wt 254.0 lb

## 2017-09-23 DIAGNOSIS — Z8679 Personal history of other diseases of the circulatory system: Secondary | ICD-10-CM | POA: Diagnosis not present

## 2017-09-23 DIAGNOSIS — N912 Amenorrhea, unspecified: Secondary | ICD-10-CM

## 2017-09-23 DIAGNOSIS — Z124 Encounter for screening for malignant neoplasm of cervix: Secondary | ICD-10-CM | POA: Diagnosis not present

## 2017-09-23 DIAGNOSIS — Z01419 Encounter for gynecological examination (general) (routine) without abnormal findings: Secondary | ICD-10-CM

## 2017-09-23 DIAGNOSIS — N951 Menopausal and female climacteric states: Secondary | ICD-10-CM | POA: Diagnosis present

## 2017-09-23 NOTE — Patient Instructions (Signed)
EXERCISE AND DIET:  We recommended that you start or continue a regular exercise program for good health. Regular exercise means any activity that makes your heart beat faster and makes you sweat.  We recommend exercising at least 30 minutes per day at least 3 days a week, preferably 4 or 5.  We also recommend a diet low in fat and sugar.  Inactivity, poor dietary choices and obesity can cause diabetes, heart attack, stroke, and kidney damage, among others.    ALCOHOL AND SMOKING:  Women should limit their alcohol intake to no more than 7 drinks/beers/glasses of wine (combined, not each!) per week. Moderation of alcohol intake to this level decreases your risk of breast cancer and liver damage. And of course, no recreational drugs are part of a healthy lifestyle.  And absolutely no smoking or even second hand smoke. Most people know smoking can cause heart and lung diseases, but did you know it also contributes to weakening of your bones? Aging of your skin?  Yellowing of your teeth and nails?  CALCIUM AND VITAMIN D:  Adequate intake of calcium and Vitamin D are recommended.  The recommendations for exact amounts of these supplements seem to change often, but generally speaking 600 mg of calcium (either carbonate or citrate) and 800 units of Vitamin D per day seems prudent. Certain women may benefit from higher intake of Vitamin D.  If you are among these women, your doctor will have told you during your visit.    PAP SMEARS:  Pap smears, to check for cervical cancer or precancers,  have traditionally been done yearly, although recent scientific advances have shown that most women can have pap smears less often.  However, every woman still should have a physical exam from her gynecologist every year. It will include a breast check, inspection of the vulva and vagina to check for abnormal growths or skin changes, a visual exam of the cervix, and then an exam to evaluate the size and shape of the uterus and  ovaries.  And after 52 years of age, a rectal exam is indicated to check for rectal cancers. We will also provide age appropriate advice regarding health maintenance, like when you should have certain vaccines, screening for sexually transmitted diseases, bone density testing, colonoscopy, mammograms, etc.   MAMMOGRAMS:  All women over 40 years old should have a yearly mammogram. Many facilities now offer a "3D" mammogram, which may cost around $50 extra out of pocket. If possible,  we recommend you accept the option to have the 3D mammogram performed.  It both reduces the number of women who will be called back for extra views which then turn out to be normal, and it is better than the routine mammogram at detecting truly abnormal areas.    COLONOSCOPY:  Colonoscopy to screen for colon cancer is recommended for all women at age 50.  We know, you hate the idea of the prep.  We agree, BUT, having colon cancer and not knowing it is worse!!  Colon cancer so often starts as a polyp that can be seen and removed at colonscopy, which can quite literally save your life!  And if your first colonoscopy is normal and you have no family history of colon cancer, most women don't have to have it again for 10 years.  Once every ten years, you can do something that may end up saving your life, right?  We will be happy to help you get it scheduled when you are ready.    Be sure to check your insurance coverage so you understand how much it will cost.  It may be covered as a preventative service at no cost, but you should check your particular policy.     Menopause Menopause is the normal time of life when menstrual periods stop completely. Menopause is complete when you have missed 12 consecutive menstrual periods. It usually occurs between the ages of 41 years and 39 years. Very rarely does a woman develop menopause before the age of 91 years. At menopause, your ovaries stop producing the female hormones estrogen and  progesterone. This can cause undesirable symptoms and also affect your health. Sometimes the symptoms may occur 4-5 years before the menopause begins. There is no relationship between menopause and:  Oral contraceptives.  Number of children you had.  Race.  The age your menstrual periods started (menarche).  Heavy smokers and very thin women may develop menopause earlier in life. What are the causes?  The ovaries stop producing the female hormones estrogen and progesterone. Other causes include:  Surgery to remove both ovaries.  The ovaries stop functioning for no known reason.  Tumors of the pituitary gland in the brain.  Medical disease that affects the ovaries and hormone production.  Radiation treatment to the abdomen or pelvis.  Chemotherapy that affects the ovaries.  What are the signs or symptoms?  Hot flashes.  Night sweats.  Decrease in sex drive.  Vaginal dryness and thinning of the vagina causing painful intercourse.  Dryness of the skin and developing wrinkles.  Headaches.  Tiredness.  Irritability.  Memory problems.  Weight gain.  Bladder infections.  Hair growth of the face and chest.  Infertility. More serious symptoms include:  Loss of bone (osteoporosis) causing breaks (fractures).  Depression.  Hardening and narrowing of the arteries (atherosclerosis) causing heart attacks and strokes.  How is this diagnosed?  When the menstrual periods have stopped for 12 straight months.  Physical exam.  Hormone studies of the blood. How is this treated? There are many treatment choices and nearly as many questions about them. The decisions to treat or not to treat menopausal changes is an individual choice made with your health care provider. Your health care provider can discuss the treatments with you. Together, you can decide which treatment will work best for you. Your treatment choices may include:  Hormone therapy (estrogen and  progesterone).  Non-hormonal medicines.  Treating the individual symptoms with medicine (for example antidepressants for depression).  Herbal medicines that may help specific symptoms.  Counseling by a psychiatrist or psychologist.  Group therapy.  Lifestyle changes including: ? Eating healthy. ? Regular exercise. ? Limiting caffeine and alcohol. ? Stress management and meditation.  No treatment.  Follow these instructions at home:  Take the medicine your health care provider gives you as directed.  Get plenty of sleep and rest.  Exercise regularly.  Eat a diet that contains calcium (good for the bones) and soy products (acts like estrogen hormone).  Avoid alcoholic beverages.  Do not smoke.  If you have hot flashes, dress in layers.  Take supplements, calcium, and vitamin D to strengthen bones.  You can use over-the-counter lubricants or moisturizers for vaginal dryness.  Group therapy is sometimes very helpful.  Acupuncture may be helpful in some cases. Contact a health care provider if:  You are not sure you are in menopause.  You are having menopausal symptoms and need advice and treatment.  You are still having menstrual periods after age 35  years.  You have pain with intercourse.  Menopause is complete (no menstrual period for 12 months) and you develop vaginal bleeding.  You need a referral to a specialist (gynecologist, psychiatrist, or psychologist) for treatment. Get help right away if:  You have severe depression.  You have excessive vaginal bleeding.  You fell and think you have a broken bone.  You have pain when you urinate.  You develop leg or chest pain.  You have a fast pounding heart beat (palpitations).  You have severe headaches.  You develop vision problems.  You feel a lump in your breast.  You have abdominal pain or severe indigestion. This information is not intended to replace advice given to you by your health care  provider. Make sure you discuss any questions you have with your health care provider. Document Released: 01/24/2004 Document Revised: 04/10/2016 Document Reviewed: 06/02/2013 Elsevier Interactive Patient Education  2017 [employer].

## 2017-09-23 NOTE — Progress Notes (Signed)
51 y.o. G15P003  Married  African American Fe here for annual exam. Last real period 3 years ago has and spotting episode 2 times yearly in past 2 years with 3 day duration, color is brown to pink. Has symptoms with breast tenderness and cramping like period will start, but nothing happens. Complaining hot flashes and night sweats that began 3 years ago, less intense now. Denies vaginal dryness. Wears glasses and has migraine headaches less with ?menopause. Sees PCP Kelton Pillar for hypertension, anxiety/depression, and GERD, labs. No mammogram since 2015, but no changes with self breast exam. No change in weight in past year. No other health concerns today.  Patient's last menstrual period was 09/15/2017.          Sexually active: Yes.    The current method of family planning is tubal ligation.    Exercising: No.  exercise Smoker:  no  Health Maintenance: Pap:  07-31-14 neg History of Abnormal Pap: no MMG:  08-16-14 category b density birads 1:neg Self Breast exams: yes Colonoscopy:  06-20-16 normal BMD:   none TDaP:  Unsure will check with PCP Shingles: no Pneumonia: no Hep C and HIV: HIV neg 2015 Labs: yes if needed   reports that  has never smoked. she has never used smokeless tobacco. She reports that she does not drink alcohol or use drugs.  Past Medical History:  Diagnosis Date  . Anxiety   . Chest pain   . Depression   . GERD (gastroesophageal reflux disease)   . Headache    migraines  . Hypertension, essential   . Hypokalemia    on HCTZ  . Migraines   . Urinary frequency   . Wears glasses     Past Surgical History:  Procedure Laterality Date  . COLONOSCOPY    . TUBAL LIGATION  1991    Current Outpatient Medications  Medication Sig Dispense Refill  . acetaminophen (TYLENOL) 500 MG tablet Take 1,500 mg by mouth 2 (two) times daily as needed for moderate pain or headache.    Marland Kitchen aspirin-acetaminophen-caffeine (EXCEDRIN MIGRAINE) 250-250-65 MG tablet Take 2 tablets by  mouth every 6 (six) hours as needed for headache.    . hydrochlorothiazide (HYDRODIURIL) 25 MG tablet Take 25 mg by mouth daily.    . metoprolol (LOPRESSOR) 50 MG tablet Take 50 mg by mouth 2 (two) times daily.    . ranitidine (ZANTAC) 150 MG tablet Take 150 mg by mouth 2 (two) times daily.    . sertraline (ZOLOFT) 50 MG tablet Take 50 mg by mouth daily.    . Turmeric 500 MG TABS Take 500 mg daily by mouth.      No current facility-administered medications for this visit.     Family History  Problem Relation Age of Onset  . Diabetes Mother   . Hypertension Mother   . Cancer Father     ROS:  Pertinent items are noted in HPI.  Otherwise, a comprehensive ROS was negative.  Exam:   BP 126/72   Pulse 70   Resp 16   Ht 5' 2.75" (1.594 m)   Wt 254 lb (115.2 kg)   LMP 09/15/2017 Comment: spotting  BMI 45.35 kg/m  Height: 5' 2.75" (159.4 cm) Ht Readings from Last 3 Encounters:  09/23/17 5' 2.75" (1.594 m)  10/07/16 5' 2.5" (1.588 m)  04/13/15 5\' 2"  (1.575 m)    General appearance: alert, cooperative and appears stated age, obese Head: Normocephalic, without obvious abnormality, atraumatic Neck: no adenopathy, supple, symmetrical,  trachea midline and thyroid normal to inspection and palpation Lungs: clear to auscultation bilaterally Breasts: normal appearance, no masses or tenderness, No nipple retraction or dimpling, No nipple discharge or bleeding, No axillary or supraclavicular adenopathy Heart: regular rate and rhythm Abdomen: soft, non-tender; no masses,  no organomegaly, scars from gallbladder removal only Extremities: extremities normal, atraumatic, no cyanosis or edema Skin: Skin color, texture, turgor normal. No rashes or lesions Lymph nodes: Cervical, supraclavicular, and axillary nodes normal. No abnormal inguinal nodes palpated Neurologic: Grossly normal   Pelvic: External genitalia:  no lesions              Urethra:  normal appearing urethra with no masses,  tenderness or lesions              Bartholin's and Skene's: normal                 Vagina: normal appearing vagina with normal color and discharge, no lesions              Cervix: no bleeding following Pap, no cervical motion tenderness and no lesions              Pap taken: Yes.   Bimanual Exam:  Uterus:  normal size, contour, position, consistency, mobility, non-tender              Adnexa: normal adnexa and no mass, fullness, tenderness               Rectovaginal: Confirms               Anus:  normal sphincter tone, no lesions  Chaperone present: yes  A:  Well Woman with normal exam  Contraception BTL  Menopausal symptoms with amenorrhea for one year 2016, then spotting 1-2 times 2017, 2018 recently in past few months ? Date  ? Post menopausal bleeding  Mammogram due  Hypertension, GERD with PCP management     P:   Reviewed health and wellness pertinent to exam  Discussed concerns with if actual menopause or just missing periods with increase weight change. Will need to do labs to evaluate if other reason for amenorrhea such as thyroid or pituitary change. Patient agreeable. Also discussed if post menopausal will need evaluation with PUS and possible endometrial biopsy . Questions answered at length. Patient voiced understanding. Patient will be called with information on PUS once labs in.  Lab: TSH,FSH, Prolactin  Discussed importance of yearly mammogram and given information to schedule.  Continue follow up with PCP as indicated  Pap smear: yes   counseled on breast self exam, mammography screening, menopause, adequate intake of calcium and vitamin D, diet and exercise for weight loss and healthy life style.  return annually or prn  An After Visit Summary was printed and given to the patient.

## 2017-09-24 LAB — PROLACTIN: Prolactin: 6.1 ng/mL (ref 4.8–23.3)

## 2017-09-24 LAB — FOLLICLE STIMULATING HORMONE: FSH: 31.9 m[IU]/mL

## 2017-09-24 LAB — TSH: TSH: 2.5 u[IU]/mL (ref 0.450–4.500)

## 2017-09-25 ENCOUNTER — Telehealth: Payer: Self-pay

## 2017-09-25 LAB — CYTOLOGY - PAP
Chlamydia: NEGATIVE
Diagnosis: NEGATIVE
HPV: NOT DETECTED
Neisseria Gonorrhea: NEGATIVE

## 2017-09-25 MED ORDER — METRONIDAZOLE 500 MG PO TABS: 500.0000 mg | ORAL_TABLET | Freq: Two times a day (BID) | ORAL | 0 refills | Status: DC

## 2017-09-25 NOTE — Telephone Encounter (Signed)
Spoke with patient. Results given as seen below from [place]. Aware she will need to abstain from intercourse until she and her partner have received treatment and for 1 week following. Will need to use condoms for protection against STD's with intercourse.  Patient verbalizes understanding. Rx for Flagyl 500 mg po BID x 7 days #14 0RF sent to pharmacy on file. Avoid alcohol during treatment and 24 hours after completing medication. Don't mix with alcohol if mixed can cause severe nausea, vomiting and abdominal cramping. 02 recall entered. 1 month recheck scheduled for 10/27/2017 at 10 am with Melvia Heaps CNM. Appointment for EMB scheduled for 10/06/2017 at 9 am with Dr.Jertson. Patient is agreeable to date and time.   Notes recorded by Regina Eck, CNM on 09/25/2017 at 1:12 PM EST Notify patient that her pap smear is negative HPVHR not detected 02 Also showed trichomonas infection which is sexually transmitted. Needs Rx Flagyl 500 mg bid x 7 days and recheck in one month for TOC ------  Notes recorded by Regina Eck, CNM on 09/24/2017 at 5:26 PM EST Notify patient that her TSH is normal, prolactin normal FSH does not show questionable menopausal range, feel she may just have being having irregular periods, but this puts her at risk for hyperplasia and needs to endometrial biopsy to assess. Confirmed this with Dr. Talbert Nan. Please schedule, no order placed.  Routing to provider for final review. Patient agreeable to disposition. Will close encounter.

## 2017-10-06 ENCOUNTER — Other Ambulatory Visit: Payer: Self-pay

## 2017-10-06 ENCOUNTER — Encounter: Payer: Self-pay | Admitting: Obstetrics and Gynecology

## 2017-10-06 ENCOUNTER — Ambulatory Visit: Payer: Commercial Managed Care - PPO | Admitting: Obstetrics and Gynecology

## 2017-10-06 ENCOUNTER — Telehealth: Payer: Self-pay | Admitting: *Deleted

## 2017-10-06 VITALS — BP 128/82 | HR 64 | Resp 16 | Wt 251.0 lb

## 2017-10-06 DIAGNOSIS — F321 Major depressive disorder, single episode, moderate: Secondary | ICD-10-CM | POA: Diagnosis not present

## 2017-10-06 DIAGNOSIS — Z113 Encounter for screening for infections with a predominantly sexual mode of transmission: Secondary | ICD-10-CM | POA: Diagnosis not present

## 2017-10-06 DIAGNOSIS — N939 Abnormal uterine and vaginal bleeding, unspecified: Secondary | ICD-10-CM

## 2017-10-06 DIAGNOSIS — Z8619 Personal history of other infectious and parasitic diseases: Secondary | ICD-10-CM

## 2017-10-06 NOTE — Progress Notes (Signed)
GYNECOLOGY  VISIT   HPI: 51 y.o.   Married  [place] American  female   [phone] with Patient's last menstrual period was 09/15/2017.   here for EMB for abnormal uterine bleeding. She hasn't had a regular period in years. The patient had 11 months of amenorrhea in 2016, then spotting 1-2 times in 2017 and again several times in 2018.  In the last year she has symptoms that her cycle is going to start every month. She has had spotting every 2-3 months for 1-3 days. No cramps.  FSH was 31.9, normal prolactin and TSH. Pap from a few weeks ago was + for trich She is having tolerable hot flashes, but getting worse. No night sweats. Sleeping okay. Sexually active, no pain.   3 kids, 5 grand kids. She got teary during our discussion. Things are hard at home. Husband (married for 5 years) is critical of her. Her life revolves around him, she takes him to work, brings him dinner, drives him home. It sounds like he is depressed, he works and sleeps. They don't do anything. Her kids don't like him so she barely see's her kids or grand kids. She doesn't work. Doesn't have friends, family is not near by. She is isolated. She did reach out to the [employer] center and is trying to get a counselor. She denies thoughts of hurting herself.   GYNECOLOGIC HISTORY: Patient's last menstrual period was 09/15/2017. Contraception:postmenopause  Menopausal hormone therapy: none         OB History    Gravida Para Term Preterm AB Living   3 3 3     3    SAB TAB Ectopic Multiple Live Births           3         Patient Active Problem List   Diagnosis Date Noted  . Morbid obesity with BMI of 45.0-49.9, adult ([place]) 07/31/2014  . Unstable angina ([place]) 08/01/2012  . Hypertension, essential   . GERD (gastroesophageal reflux disease)   . Hypokalemia     Past Medical History:  Diagnosis Date  . Anxiety   . Chest pain   . Depression   . GERD (gastroesophageal reflux disease)   . Headache    migraines  .  Hypertension, essential   . Hypokalemia    on HCTZ  . Migraines   . STD (sexually transmitted disease)    trichomonas on pap treated 2015  . Urinary frequency   . Wears glasses     Past Surgical History:  Procedure Laterality Date  . COLONOSCOPY    . EXCISION SUBCUTANEOUS  LIPOMA RIGHT THIGH Right 10/14/2016   Performed by Donnie Mesa, MD at [facility] OR  . LAPAROSCOPIC CHOLECYSTECTOMY WITH INTRAOPERATIVE CHOLANGIOGRAM N/A 10/14/2016   Performed by Donnie Mesa, MD at [facility] OR  . LEFT HEART CATHETERIZATION WITH CORONARY ANGIOGRAM N/A 08/02/2012   Performed by Sherren Mocha, MD at [facility] CATH LAB  . TUBAL LIGATION  1991    Current Outpatient Medications  Medication Sig Dispense Refill  . acetaminophen (TYLENOL) 500 MG tablet Take 1,500 mg by mouth 2 (two) times daily as needed for moderate pain or headache.    Marland Kitchen aspirin-acetaminophen-caffeine (EXCEDRIN MIGRAINE) 250-250-65 MG tablet Take 2 tablets by mouth every 6 (six) hours as needed for headache.    . hydrochlorothiazide (HYDRODIURIL) 25 MG tablet Take 25 mg by mouth daily.    . metoprolol (LOPRESSOR) 50 MG tablet Take 50 mg by mouth 2 (two) times daily.    Marland Kitchen  metroNIDAZOLE (FLAGYL) 500 MG tablet Take 1 tablet (500 mg total) 2 (two) times daily by mouth. 14 tablet 0  . ranitidine (ZANTAC) 150 MG tablet Take 150 mg by mouth 2 (two) times daily.    . sertraline (ZOLOFT) 50 MG tablet Take 50 mg by mouth daily.    . Turmeric 500 MG TABS Take 500 mg daily by mouth.      No current facility-administered medications for this visit.      ALLERGIES: Patient has no known allergies.  Family History  Problem Relation Age of Onset  . Diabetes Mother   . Hypertension Mother   . Cancer Father   . Hypertension Sister   . Stroke Sister     Social History   Socioeconomic History  . Marital status: Married    Spouse name: Not on file  . Number of children: Not on file  . Years of education: Not on file  . Highest education level: Not on  file  Social Needs  . Financial resource strain: Not on file  . Food insecurity - worry: Not on file  . Food insecurity - inability: Not on file  . Transportation needs - medical: Not on file  . Transportation needs - non-medical: Not on file  Occupational History  . Not on file  Tobacco Use  . Smoking status: Never Smoker  . Smokeless tobacco: Never Used  Substance and Sexual Activity  . Alcohol use: No  . Drug use: No  . Sexual activity: Yes    Partners: Male    Birth control/protection: Surgical    Comment: BTL  Other Topics Concern  . Not on file  Social History Narrative  . Not on file    Review of Systems  Constitutional: Negative.   HENT: Negative.   Eyes: Negative.   Respiratory: Negative.   Cardiovascular: Negative.   Gastrointestinal: Negative.   Genitourinary: Negative.   Musculoskeletal: Negative.   Skin: Negative.   Neurological: Negative.   Endo/Heme/Allergies: Negative.   Psychiatric/Behavioral: Negative.     PHYSICAL EXAMINATION:    BP 128/82 (BP Location: Right Arm, Patient Position: Sitting, Cuff Size: Normal)   Pulse 64   Resp 16   Wt 251 lb (113.9 kg)   LMP 09/15/2017 Comment: spotting  BMI 44.82 kg/m     General appearance: alert, cooperative and appears stated age  Pelvic: External genitalia:  no lesions              Urethra:  normal appearing urethra with no masses, tenderness or lesions              Bartholins and Skenes: normal                 Vagina: normal appearing vagina with normal color and discharge, no lesions              Cervix: no lesions              Bimanual Exam:  Uterus:  normal size, contour, position, consistency, mobility, non-tender              Adnexa: no mass, fullness, tenderness                The risks of endometrial biopsy were reviewed and a consent was obtained.  A speculum was placed in the vagina and the cervix was cleansed with betadine. A tenaculum was placed on the cervix and the mini-pipelle was  placed into the endometrial cavity. The uterus sounded  to 9 cm. The endometrial biopsy was performed, minimal tissue was obtained. The tenaculum and speculum were removed. There were no complications.   Chaperone was present for exam.  ASSESSMENT Abnormal uterine bleeding, has never gone a year without bleeding. FSH is just in the menopausal range. Normal TSH and prolactin Depression, on Zoloft    PLAN Endometrial biopsy done Will set her up for an ultrasound, possible sonohysterogram Recommended counseling, trying to get out of the house, consider working (currently isolated) She will discuss her Zoloft dose with her primary, she might benefit from a higher dose.    An After Visit Summary was printed and given to the patient.  After the patient left, I saw her pap from a few weeks ago that returned as negative, with negative hpv, but + for trich. She was treated and she was instructed that her partner needed to be treated. She needs full STD testing. We called the patient to return for further discussion. The patient had a one time sexual encounter with another man, they used a condom, but it came off. States she hasn't been sexually active with her husband. Discussed the need for STD testing. GC/CT added on to her pap from earlier this month, blood work drawn.   ~25 minutes face to face time of which over 50% was spent in counseling.   CC: Evalee Mutton, CNM

## 2017-10-06 NOTE — Telephone Encounter (Signed)
Left message to call back concerning the need for additional STD testing since she tested positive for Trich.  Called Cytology and added GC/CT to PAP test.  Patient still needs blood work for STD testing.

## 2017-10-06 NOTE — Patient Instructions (Signed)

## 2017-10-06 NOTE — Telephone Encounter (Signed)
Spoke with patient- she is going to come back in -eh

## 2017-10-07 LAB — HEP, RPR, HIV PANEL
HIV Screen 4th Generation wRfx: NONREACTIVE
Hepatitis B Surface Ag: NEGATIVE
RPR Ser Ql: NONREACTIVE

## 2017-10-07 LAB — HEPATITIS C ANTIBODY: Hep C Virus Ab: <0.1 s/co ratio (ref 0.0–0.9)

## 2017-10-13 ENCOUNTER — Other Ambulatory Visit: Payer: Self-pay | Admitting: *Deleted

## 2017-10-13 DIAGNOSIS — N939 Abnormal uterine and vaginal bleeding, unspecified: Secondary | ICD-10-CM

## 2017-10-13 MED ORDER — MEDROXYPROGESTERONE ACETATE 10 MG PO TABS: 10.0000 mg | ORAL_TABLET | Freq: Every day | ORAL | 0 refills | Status: DC

## 2017-10-20 ENCOUNTER — Other Ambulatory Visit: Payer: Commercial Managed Care - PPO | Admitting: Obstetrics and Gynecology

## 2017-10-20 ENCOUNTER — Other Ambulatory Visit: Payer: Commercial Managed Care - PPO

## 2017-10-26 ENCOUNTER — Telehealth: Payer: Self-pay | Admitting: Certified Nurse Midwife

## 2017-10-26 NOTE — Telephone Encounter (Signed)
1 month recheck/ks  10/26/17 lmtcb re: dr cx due to inclement weather/[place]  Routing to [place] for assistance with rescheduling.

## 2017-10-27 ENCOUNTER — Ambulatory Visit: Payer: Commercial Managed Care - PPO | Admitting: Certified Nurse Midwife

## 2017-10-27 NOTE — Telephone Encounter (Signed)
Left message for patient to callback & schedule 43mth recheck appt.

## 2017-10-30 NOTE — Telephone Encounter (Signed)
1. Patient declined to reschedule her 1 month recheck at this time. She said she is doing well and will reschedule when she calls back to reschedule her ultrasound. She'd like to see Dr. Talbert Nan for the 1 month recheck since Dr. Talbert Nan is seeing her for ultrasound.  2. Patient called and cancelled her appointment for an ultrasound next Tuesday, 11/03/17. She'd like to reschedule to sometime next year.  Cc: Dr. Talbert Nan

## 2017-11-02 NOTE — Telephone Encounter (Signed)
Routing to [employer], FYI

## 2017-11-03 ENCOUNTER — Other Ambulatory Visit: Payer: Commercial Managed Care - PPO | Admitting: Obstetrics and Gynecology

## 2017-11-03 ENCOUNTER — Other Ambulatory Visit: Payer: Commercial Managed Care - PPO

## 2018-07-08 ENCOUNTER — Other Ambulatory Visit: Payer: Self-pay

## 2018-07-08 DIAGNOSIS — I82492 Acute embolism and thrombosis of other specified deep vein of left lower extremity: Secondary | ICD-10-CM

## 2018-07-09 ENCOUNTER — Ambulatory Visit (HOSPITAL_COMMUNITY)
Admission: RE | Admit: 2018-07-09 | Discharge: 2018-07-09 | Disposition: A | Discharge status: Home / Self Care | Payer: Commercial Managed Care - PPO | Source: Ambulatory Visit | Attending: Family | Admitting: Family

## 2018-07-09 DIAGNOSIS — M79605 Pain in left leg: Secondary | ICD-10-CM | POA: Insufficient documentation

## 2018-07-09 DIAGNOSIS — I82492 Acute embolism and thrombosis of other specified deep vein of left lower extremity: Secondary | ICD-10-CM

## 2018-07-09 DIAGNOSIS — M25862 Other specified joint disorders, left knee: Secondary | ICD-10-CM | POA: Insufficient documentation

## 2018-07-09 DIAGNOSIS — M7989 Other specified soft tissue disorders: Secondary | ICD-10-CM | POA: Diagnosis not present

## 2018-09-09 ENCOUNTER — Other Ambulatory Visit: Payer: Self-pay | Admitting: Orthopaedic Surgery

## 2018-09-09 DIAGNOSIS — M542 Cervicalgia: Secondary | ICD-10-CM

## 2018-09-13 ENCOUNTER — Ambulatory Visit
Admission: RE | Admit: 2018-09-13 | Discharge: 2018-09-13 | Disposition: A | Discharge status: Home / Self Care | Payer: Commercial Managed Care - PPO | Source: Ambulatory Visit | Attending: Orthopaedic Surgery | Admitting: Orthopaedic Surgery

## 2018-09-13 DIAGNOSIS — M542 Cervicalgia: Secondary | ICD-10-CM

## 2018-10-06 ENCOUNTER — Ambulatory Visit: Payer: Commercial Managed Care - PPO | Admitting: Certified Nurse Midwife

## 2018-10-12 ENCOUNTER — Ambulatory Visit: Payer: Commercial Managed Care - PPO | Admitting: Certified Nurse Midwife

## 2019-01-11 DIAGNOSIS — R635 Abnormal weight gain: Secondary | ICD-10-CM | POA: Diagnosis not present

## 2019-01-11 DIAGNOSIS — J0100 Acute maxillary sinusitis, unspecified: Secondary | ICD-10-CM | POA: Diagnosis not present

## 2020-02-06 ENCOUNTER — Encounter: Payer: Self-pay | Admitting: Certified Nurse Midwife

## 2020-07-16 DIAGNOSIS — I10 Essential (primary) hypertension: Secondary | ICD-10-CM | POA: Diagnosis not present

## 2020-08-25 DIAGNOSIS — J0110 Acute frontal sinusitis, unspecified: Secondary | ICD-10-CM | POA: Diagnosis not present

## 2020-10-31 DIAGNOSIS — J019 Acute sinusitis, unspecified: Secondary | ICD-10-CM | POA: Diagnosis not present

## 2020-11-30 DIAGNOSIS — J309 Allergic rhinitis, unspecified: Secondary | ICD-10-CM | POA: Diagnosis not present

## 2020-11-30 DIAGNOSIS — J329 Chronic sinusitis, unspecified: Secondary | ICD-10-CM | POA: Diagnosis not present

## 2020-11-30 DIAGNOSIS — R519 Headache, unspecified: Secondary | ICD-10-CM | POA: Diagnosis not present

## 2020-12-12 DIAGNOSIS — R34 Anuria and oliguria: Secondary | ICD-10-CM | POA: Diagnosis not present

## 2020-12-12 DIAGNOSIS — K5900 Constipation, unspecified: Secondary | ICD-10-CM | POA: Diagnosis not present

## 2020-12-28 DIAGNOSIS — J321 Chronic frontal sinusitis: Secondary | ICD-10-CM | POA: Diagnosis not present

## 2020-12-28 DIAGNOSIS — J309 Allergic rhinitis, unspecified: Secondary | ICD-10-CM | POA: Diagnosis not present

## 2020-12-28 DIAGNOSIS — J329 Chronic sinusitis, unspecified: Secondary | ICD-10-CM | POA: Diagnosis not present

## 2020-12-28 DIAGNOSIS — R519 Headache, unspecified: Secondary | ICD-10-CM | POA: Diagnosis not present

## 2020-12-28 DIAGNOSIS — J320 Chronic maxillary sinusitis: Secondary | ICD-10-CM | POA: Diagnosis not present

## 2020-12-28 DIAGNOSIS — J3489 Other specified disorders of nose and nasal sinuses: Secondary | ICD-10-CM | POA: Diagnosis not present

## 2021-01-04 DIAGNOSIS — Z0000 Encounter for general adult medical examination without abnormal findings: Secondary | ICD-10-CM | POA: Diagnosis not present

## 2021-01-04 DIAGNOSIS — J309 Allergic rhinitis, unspecified: Secondary | ICD-10-CM | POA: Diagnosis not present

## 2021-01-04 DIAGNOSIS — I10 Essential (primary) hypertension: Secondary | ICD-10-CM | POA: Diagnosis not present

## 2021-01-04 DIAGNOSIS — Z13220 Encounter for screening for lipoid disorders: Secondary | ICD-10-CM | POA: Diagnosis not present

## 2021-01-04 DIAGNOSIS — F39 Unspecified mood [affective] disorder: Secondary | ICD-10-CM | POA: Diagnosis not present

## 2021-01-04 DIAGNOSIS — D649 Anemia, unspecified: Secondary | ICD-10-CM | POA: Diagnosis not present

## 2021-01-07 ENCOUNTER — Encounter: Payer: Self-pay | Admitting: Neurology

## 2021-01-14 DIAGNOSIS — Z1211 Encounter for screening for malignant neoplasm of colon: Secondary | ICD-10-CM | POA: Diagnosis not present

## 2021-02-08 DIAGNOSIS — H6981 Other specified disorders of Eustachian tube, right ear: Secondary | ICD-10-CM | POA: Diagnosis not present

## 2021-03-13 NOTE — Progress Notes (Signed)
NEUROLOGY CONSULTATION NOTE  Erin Young MRN: [id number] DOB: [birth year]  Referring provider: Kelton Pillar, MD Primary care provider: Kelton Pillar, MD  Reason for consult:  migraines  Assessment/Plan:   1.  Chronic tension type headache - likely multifactorial due to medication overuse, depression, and poor sleep hygiene 2.  Depression and anxiety  1.  Previously on sertraline for depression which was helpful for depression and headache - restart 25mg  daily for one week, then increase to 50mg  daily.  We can increase dose in 5 weeks if needed. 2.  Limit use of pain relievers to no more than 2 days out of week to prevent risk of rebound or medication-overuse headache. 3.  Keep headache diary 4.  Eliminate caffeine, increase water intake, do not skip meals 5.  Follow sleep hygiene instructions 6.  Follow up in 6 months.   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  History supplemented by referring provider's note.  She has had headaches for years.  Usually wakes up with the headaches and reports congestion.  Sometimes they get worse during the day.  Initially mild but may progress to severe later in the day.  It is a pressure pain across the forehead.  Sometimes sees spots and photophobia but no nausea, vomiting, phonophobia, autonomic symptoms (other than congestion), numbness or weakness.  They will last all day if she doesn't take anything.  They occur daily.  She takes Excedrin Migraine or ibuprofen which usually helps but sometimes headache returns later in the day.  She has taken analgesics 3 to 4 days a week, daily for the past several weeks.  Over the past year, she has been treated several times for chronic sinusitis and allergic rhinitis.  Headaches persisted.  CT sinuses performed in December 2021 was clear.   ENT told her that it was time to be evaluated by  neurology.  Last eye exam was about a year ago.  Current NSAIDS/analgesics:  Excedrin Migraine, ibuprofen Current triptans:  none Current ergotamine:  none Current anti-emetic:  none Current muscle relaxants:  none Current Antihypertensive medications:  Metoprolol, HCTZ Current Antidepressant medications:  none Current Anticonvulsant medications:  Gabapentin 100mg  QD (for hot flashes) Current anti-CGRP:  none Current Vitamins/Herbal/Supplements:  none Current Antihistamines/Decongestants:  Flonase Other therapy:  none Hormone/birth control:  Provera  Past NSAIDS/analgesics:  Tylenol Past abortive triptans:  none Past abortive ergotamine:  none Past muscle relaxants:  none Past anti-emetic:  none Past antihypertensive medications:  Propranolol, atenolol, clonidine Past antidepressant medications:  Amitriptyline, sertraline, fluoxetine Past anticonvulsant medications:  none Past anti-CGRP:  none Past vitamins/Herbal/Supplements:  turmeric Past antihistamines/decongestants:  none Other past therapies:  none  Caffeine:  1/2 cup of coffee daily in the morning.  1 16oz Pepsi every other day Diet: Does not keep hydrated.  Does not skip meals.   Exercise:  Not routine Depression:  yes; Anxiety:  yes Other pain:  Right upper arm pain Sleep:  Sleeps 3 to 4 hours a night.  Trouble both falling asleep and staying asleep.  Feels a little fatigued during the day.  Does not nap during the day.   Family history of headache:  No    CBC and CMP from February reviewed.  PAST MEDICAL HISTORY: Past Medical History:  Diagnosis Date  . Anxiety   . Chest pain   . Depression   . GERD (gastroesophageal reflux disease)   .  Headache    migraines  . Hypertension, essential   . Hypokalemia    on HCTZ  . Migraines   . STD (sexually transmitted disease)    trichomonas on pap treated 2015  . Urinary frequency   . Wears glasses     PAST SURGICAL HISTORY: Past Surgical History:  Procedure  Laterality Date  . CHOLECYSTECTOMY N/A 10/14/2016   Procedure: LAPAROSCOPIC CHOLECYSTECTOMY WITH INTRAOPERATIVE CHOLANGIOGRAM;  Surgeon: Donnie Mesa, MD;  Location: [facility];  Service: General;  Laterality: N/A;  . COLONOSCOPY    . LEFT HEART CATHETERIZATION WITH CORONARY ANGIOGRAM N/A 08/02/2012   Procedure: LEFT HEART CATHETERIZATION WITH CORONARY ANGIOGRAM;  Surgeon: Sherren Mocha, MD;  Location: [facility] CATH LAB;  Service: Cardiovascular;  Laterality: N/A;  . LIPOMA EXCISION Right 10/14/2016   Procedure: EXCISION SUBCUTANEOUS  LIPOMA RIGHT THIGH;  Surgeon: Donnie Mesa, MD;  Location: [facility];  Service: General;  Laterality: Right;  . TUBAL LIGATION  1991    MEDICATIONS: Current Outpatient Medications on File Prior to Visit  Medication Sig Dispense Refill  . acetaminophen (TYLENOL) 500 MG tablet Take 1,500 mg by mouth 2 (two) times daily as needed for moderate pain or headache.    Marland Kitchen aspirin-acetaminophen-caffeine (EXCEDRIN MIGRAINE) 250-250-65 MG tablet Take 2 tablets by mouth every 6 (six) hours as needed for headache.    . hydrochlorothiazide (HYDRODIURIL) 25 MG tablet Take 25 mg by mouth daily.    . medroxyPROGESTERone (PROVERA) 10 MG tablet Take 1 tablet (10 mg total) by mouth daily. 10 tablet 0  . metoprolol (LOPRESSOR) 50 MG tablet Take 50 mg by mouth 2 (two) times daily.    . metroNIDAZOLE (FLAGYL) 500 MG tablet Take 1 tablet (500 mg total) 2 (two) times daily by mouth. 14 tablet 0  . ranitidine (ZANTAC) 150 MG tablet Take 150 mg by mouth 2 (two) times daily.    . sertraline (ZOLOFT) 50 MG tablet Take 50 mg by mouth daily.    . Turmeric 500 MG TABS Take 500 mg daily by mouth.      No current facility-administered medications on file prior to visit.    ALLERGIES: No Known Allergies  FAMILY HISTORY: Family History  Problem Relation Age of Onset  . Diabetes Mother   . Hypertension Mother   . Cancer Father   . Hypertension Sister   . Stroke Sister     Objective:  Blood  pressure (!) 145/103, pulse 69, height 5\' 2"  (1.575 m), weight 251 lb (113.9 kg), SpO2 98 %. General: No acute distress.  Patient appears well-groomed.   Head:  Normocephalic/atraumatic Eyes:  fundi examined but not visualized Neck: supple, no paraspinal tenderness, full range of motion Back: No paraspinal tenderness Heart: regular rate and rhythm Lungs: Clear to auscultation bilaterally. Vascular: No carotid bruits. Neurological Exam: Mental status: alert and oriented to person, place, and time, recent and remote memory intact, fund of knowledge intact, attention and concentration intact, speech fluent and not dysarthric, language intact. Cranial nerves: CN I: not tested CN II: pupils equal, round and reactive to light, visual fields intact CN III, IV, VI:  full range of motion, no nystagmus, bilateral ptosis CN V: facial sensation intact. CN VII: right upper and lower facial weakness CN VIII: hearing intact CN IX, X: gag intact, uvula midline CN XI: sternocleidomastoid and trapezius muscles intact CN XII: tongue midline Bulk & Tone: normal, no fasciculations. Motor:  muscle strength 5/5 throughout Sensation:  Pinprick, temperature and vibratory sensation intact. Deep Tendon Reflexes:  2+  throughout,  toes downgoing.   Finger to nose testing:  Without dysmetria.   Heel to shin:  Without dysmetria.   Gait:  Normal station and stride.  Romberg negative.    Thank you for allowing me to take part in the care of this patient.  Metta Clines, DO  CC: Kelton Pillar, MD

## 2021-03-15 ENCOUNTER — Encounter: Payer: Self-pay | Admitting: Neurology

## 2021-03-15 ENCOUNTER — Ambulatory Visit (INDEPENDENT_AMBULATORY_CARE_PROVIDER_SITE_OTHER): Payer: BC Managed Care – PPO | Admitting: Neurology

## 2021-03-15 ENCOUNTER — Other Ambulatory Visit: Payer: Self-pay

## 2021-03-15 VITALS — BP 145/103 | HR 69 | Ht 62.0 in | Wt 251.0 lb

## 2021-03-15 DIAGNOSIS — G44229 Chronic tension-type headache, not intractable: Secondary | ICD-10-CM | POA: Diagnosis not present

## 2021-03-15 DIAGNOSIS — F419 Anxiety disorder, unspecified: Secondary | ICD-10-CM

## 2021-03-15 DIAGNOSIS — F32A Depression, unspecified: Secondary | ICD-10-CM

## 2021-03-15 MED ORDER — SERTRALINE HCL 25 MG PO TABS: ORAL_TABLET | ORAL | 0 refills | Status: DC

## 2021-03-15 NOTE — Patient Instructions (Addendum)
1.  Start sertraline 25mg  - take 1 tablet daily for one week, then increase to 2 tablets daily.  When you need a refill, contact me with update and we can increase dose if needed 2.  May use Excedrin or ibuprofen but limit use to no more than 2 days out of week to prevent risk of rebound or medication-overuse headache. 3.  Keep headache diary 4.  Eliminate caffeine 5.  Increase water intake to at least 64 oz daily 6.  Start routine exercise (walks) 7.  Follow proper sleep hygiene tips 8.  Do not skip meals 9.  Follow up 6 months.   Tension Headache, Adult A tension headache is a feeling of pain, pressure, or aching in the head. It is often felt over the front and sides of the head. Tension headaches can last from 30 minutes to several days. What are the causes? The cause of this condition is not known. Sometimes, tension headaches are brought on by stress, worry (anxiety), or depression. Other things that may set them off include:  Alcohol.  Too much caffeine or caffeine withdrawal.  Colds, flu, or sinus infections.  Dental problems. This can include clenching your teeth.  Being tired.  Holding your head and neck in the same position for a long time, such as while using a computer.  Smoking.  Arthritis in the neck. What are the signs or symptoms?  Feeling pressure around the head.  A dull ache in the head.  Pain over the front and sides of the head.  Feeling sore or tender in the muscles of the head, neck, and shoulders. How is this treated? This condition may be treated with lifestyle changes and with medicines that help relieve symptoms. Follow these instructions at home: Managing pain  Take over-the-counter and prescription medicines only as told by your doctor.  When you have a headache, lie down in a dark, quiet room.  If told, put ice on your head and neck. To do this: ? Put ice in a plastic bag. ? Place a towel between your skin and the bag. ? Leave the ice  on for 20 minutes, 2-3 times a day. ? Take off the ice if your skin turns bright red. This is very important. If you cannot feel pain, heat, or cold, you have a greater risk of damage to the area.  If told, put heat on the back of your neck. Do this as often as told by your doctor. Use the heat source that your doctor recommends, such as a moist heat pack or a heating pad. ? Place a towel between your skin and the heat source. ? Leave the heat on for 20-30 minutes. ? Take off the heat if your skin turns bright red. This is very important. If you cannot feel pain, heat, or cold, you have a greater risk of getting burned. Eating and drinking  Eat meals on a regular schedule.  If you drink alcohol: ? Limit how much you have to:  0-1 drink a day for women who are not pregnant.  0-2 drinks a day for men. ? Know how much alcohol is in your drink. In the [place] one drink equals one 12 oz bottle of beer (355 mL), one 5 oz glass of wine (148 mL), or one 1 oz glass of hard liquor (44 mL).  Drink enough fluid to keep your pee (urine) pale yellow.  Do not use a lot of caffeine, or stop using caffeine. Lifestyle  Get  7-9 hours of sleep each night. Or get the amount of sleep that your doctor tells you to.  At bedtime, keep computers, phones, and tablets out of your room.  Find ways to lessen your stress. This may include: ? Exercise. ? Deep breathing. ? Yoga. ? Listening to music. ? Thinking positive thoughts.  Sit up straight. Try to relax your muscles.  Do not smoke or use any products that contain nicotine or tobacco. If you need help quitting, ask your doctor. General instructions  Avoid things that can bring on headaches. Keep a headache journal to see what may bring on headaches. For example, write down: ? What you eat and drink. ? How much sleep you get. ? Any change to your diet or medicines.  Keep all follow-up visits.   Contact a doctor if:  Your headache does not get  better.  Your headache comes back.  You have a headache, and sounds, light, or smells bother you.  You feel like you may vomit, or you vomit.  Your stomach hurts. Get help right away if:  You all of a sudden get a very bad headache with any of these things: ? A stiff neck. ? Feeling like you may vomit. ? Vomiting. ? Feeling mixed up (confused). ? Feeling weak in one part or one side of your body. ? Having trouble seeing or speaking, or both. ? Feeling short of breath. ? A rash. ? Feeling very sleepy. ? Pain in your eye or ear. ? Trouble walking or balancing. ? Feeling like you will faint, or you faint. Summary  A tension headache is pain, pressure, or aching in your head.  Tension headaches can last from 30 minutes to several days.  Lifestyle changes and medicines may help relieve pain. This information is not intended to replace advice given to you by your health care provider. Make sure you discuss any questions you have with your health care provider. Document Revised: 08/02/2020 Document Reviewed: 08/02/2020 Elsevier Patient Education  2021 [employer].

## 2021-03-28 ENCOUNTER — Other Ambulatory Visit: Payer: Self-pay | Admitting: Family Medicine

## 2021-03-28 DIAGNOSIS — Z1231 Encounter for screening mammogram for malignant neoplasm of breast: Secondary | ICD-10-CM

## 2021-04-14 ENCOUNTER — Other Ambulatory Visit: Payer: Self-pay | Admitting: Neurology

## 2021-04-19 DIAGNOSIS — D649 Anemia, unspecified: Secondary | ICD-10-CM | POA: Diagnosis not present

## 2021-05-13 ENCOUNTER — Other Ambulatory Visit: Payer: Self-pay | Admitting: Neurology

## 2021-05-24 ENCOUNTER — Ambulatory Visit
Admission: RE | Admit: 2021-05-24 | Discharge: 2021-05-24 | Disposition: A | Discharge status: Home / Self Care | Payer: BC Managed Care – PPO | Source: Ambulatory Visit

## 2021-05-24 ENCOUNTER — Other Ambulatory Visit: Payer: Self-pay

## 2021-05-24 DIAGNOSIS — Z1231 Encounter for screening mammogram for malignant neoplasm of breast: Secondary | ICD-10-CM

## 2021-06-09 ENCOUNTER — Other Ambulatory Visit: Payer: Self-pay | Admitting: Neurology

## 2021-06-17 NOTE — Progress Notes (Signed)
Sertraline hcl '25mg'$  pending.sent to plan [facility]: [facility] - Rx #: [id number] Need help? Call us at [phone] Status Additional Information Required Drug Sertraline HCl '25MG'$  tablets Form [employer] [place] Commercial Electronic Request Form (CB) Original Claim Info [phone] RESTRICTED ACCESS DRU

## 2021-06-19 NOTE — Progress Notes (Signed)
[facility] - Rx #[id number] Need help? Call us at [phone] Outcome Approvedon August 1 Effective from 06/17/2021 through 06/16/2022. Drug Sertraline HCl '25MG'$  tablets Form [employer] Form (CB) Original Claim Info [phone] RESTRICTED ACCESS DRUG--[place]

## 2021-07-05 DIAGNOSIS — R519 Headache, unspecified: Secondary | ICD-10-CM | POA: Diagnosis not present

## 2021-07-05 DIAGNOSIS — I10 Essential (primary) hypertension: Secondary | ICD-10-CM | POA: Diagnosis not present

## 2021-07-05 DIAGNOSIS — D649 Anemia, unspecified: Secondary | ICD-10-CM | POA: Diagnosis not present

## 2021-07-05 DIAGNOSIS — F324 Major depressive disorder, single episode, in partial remission: Secondary | ICD-10-CM | POA: Diagnosis not present

## 2021-08-31 ENCOUNTER — Other Ambulatory Visit: Payer: Self-pay | Admitting: Neurology

## 2021-09-20 ENCOUNTER — Ambulatory Visit: Payer: BC Managed Care – PPO | Admitting: Neurology

## 2022-03-07 DIAGNOSIS — H35043 Retinal micro-aneurysms, unspecified, bilateral: Secondary | ICD-10-CM | POA: Diagnosis not present

## 2022-04-11 DIAGNOSIS — I10 Essential (primary) hypertension: Secondary | ICD-10-CM | POA: Diagnosis not present

## 2022-04-11 DIAGNOSIS — R519 Headache, unspecified: Secondary | ICD-10-CM | POA: Diagnosis not present

## 2022-04-11 DIAGNOSIS — F339 Major depressive disorder, recurrent, unspecified: Secondary | ICD-10-CM | POA: Diagnosis not present

## 2022-04-11 DIAGNOSIS — R739 Hyperglycemia, unspecified: Secondary | ICD-10-CM | POA: Diagnosis not present

## 2022-04-11 DIAGNOSIS — D649 Anemia, unspecified: Secondary | ICD-10-CM | POA: Diagnosis not present

## 2022-04-25 DIAGNOSIS — I10 Essential (primary) hypertension: Secondary | ICD-10-CM | POA: Diagnosis not present

## 2022-04-25 DIAGNOSIS — Z1159 Encounter for screening for other viral diseases: Secondary | ICD-10-CM | POA: Diagnosis not present

## 2022-04-25 DIAGNOSIS — Z23 Encounter for immunization: Secondary | ICD-10-CM | POA: Diagnosis not present

## 2022-04-25 DIAGNOSIS — Z0000 Encounter for general adult medical examination without abnormal findings: Secondary | ICD-10-CM | POA: Diagnosis not present

## 2022-04-25 DIAGNOSIS — Z13220 Encounter for screening for lipoid disorders: Secondary | ICD-10-CM | POA: Diagnosis not present

## 2022-04-25 DIAGNOSIS — Z131 Encounter for screening for diabetes mellitus: Secondary | ICD-10-CM | POA: Diagnosis not present

## 2022-06-27 DIAGNOSIS — Z23 Encounter for immunization: Secondary | ICD-10-CM | POA: Diagnosis not present

## 2022-07-04 DIAGNOSIS — G44209 Tension-type headache, unspecified, not intractable: Secondary | ICD-10-CM | POA: Diagnosis not present

## 2022-07-04 DIAGNOSIS — R0683 Snoring: Secondary | ICD-10-CM | POA: Diagnosis not present

## 2022-07-04 DIAGNOSIS — R519 Headache, unspecified: Secondary | ICD-10-CM | POA: Diagnosis not present

## 2022-08-01 DIAGNOSIS — M545 Low back pain, unspecified: Secondary | ICD-10-CM | POA: Diagnosis not present

## 2022-08-01 DIAGNOSIS — G4440 Drug-induced headache, not elsewhere classified, not intractable: Secondary | ICD-10-CM | POA: Diagnosis not present

## 2022-12-18 IMAGING — MG MM DIGITAL SCREENING BILAT W/ TOMO AND CAD
8 series · 8 of 24 positions shown · non-contrast
Comparison: Previous exam(s).

CLINICAL DATA: Screening.

EXAM:
DIGITAL SCREENING BILATERAL MAMMOGRAM WITH TOMOSYNTHESIS AND CAD
TECHNIQUE: Bilateral screening digital craniocaudal and mediolateral oblique
mammograms were obtained. Bilateral screening digital breast
tomosynthesis was performed. The images were evaluated with
computer-aided detection.

[L MLO synth-2D]
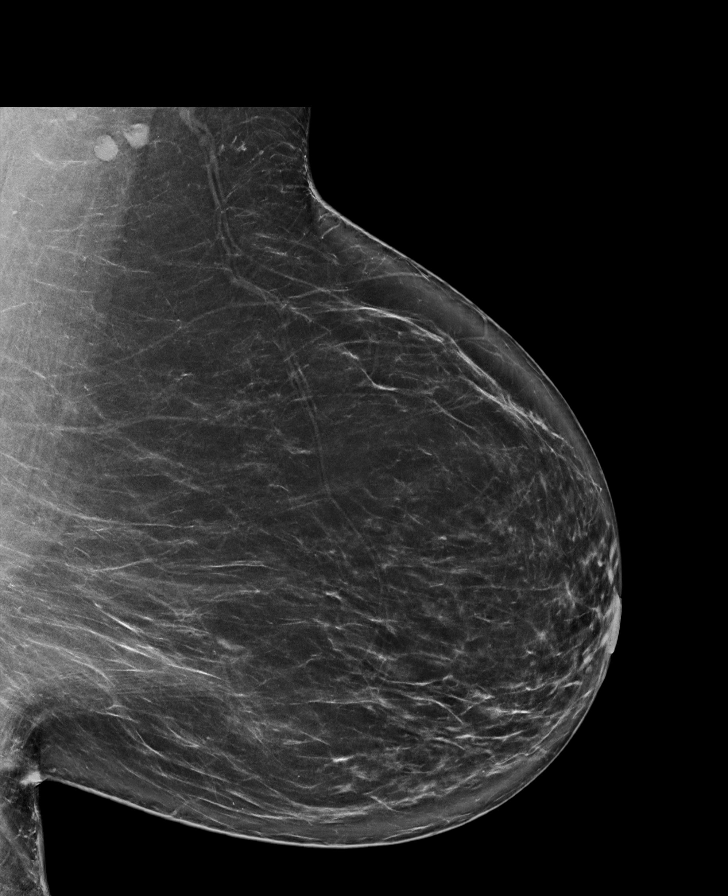

[R CC synth-2D]
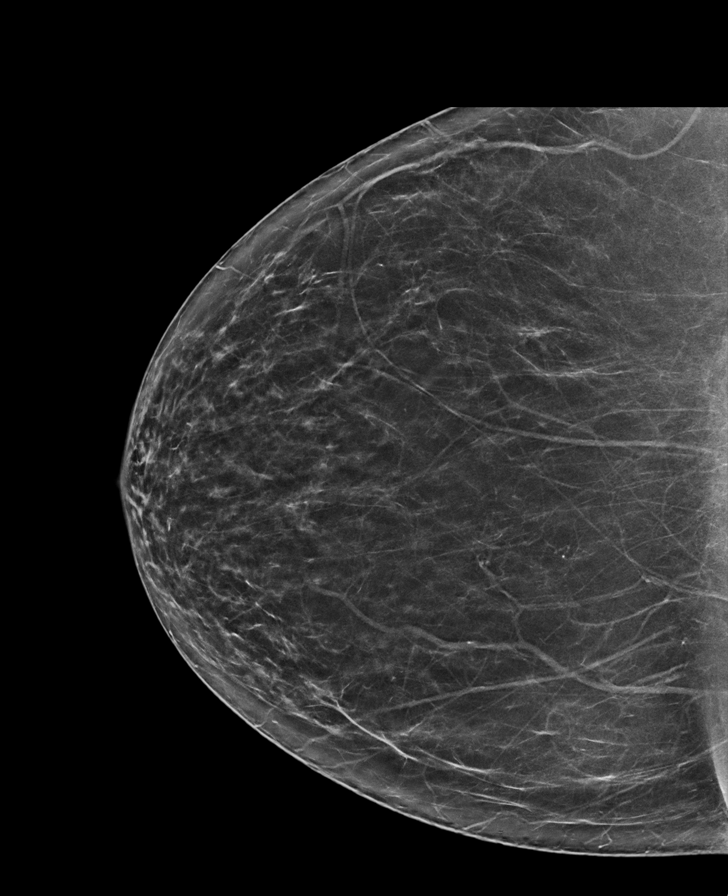

[R MLO synth-2D]
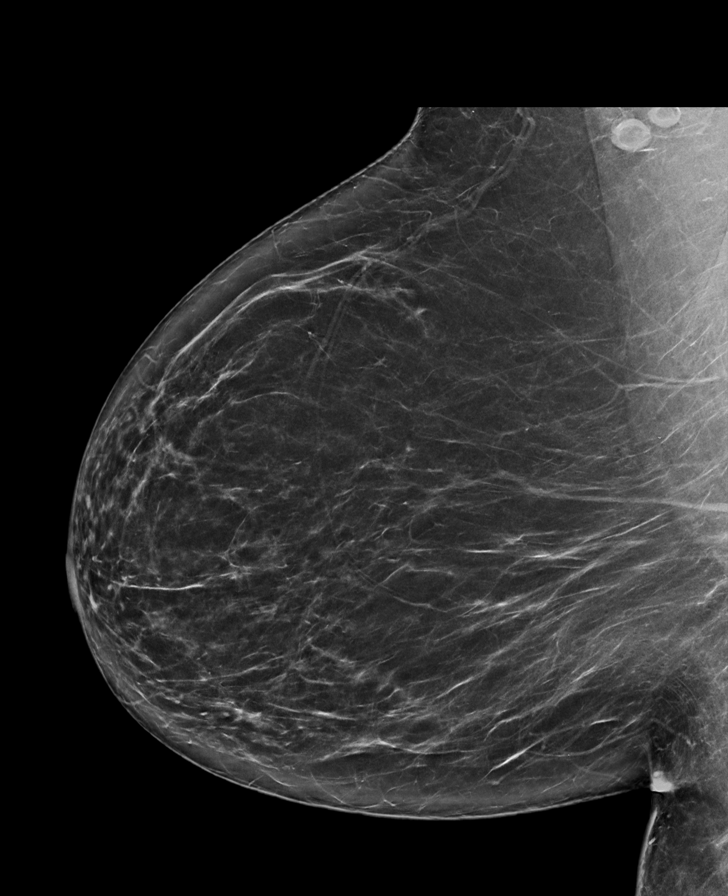

[L CC synth-2D]
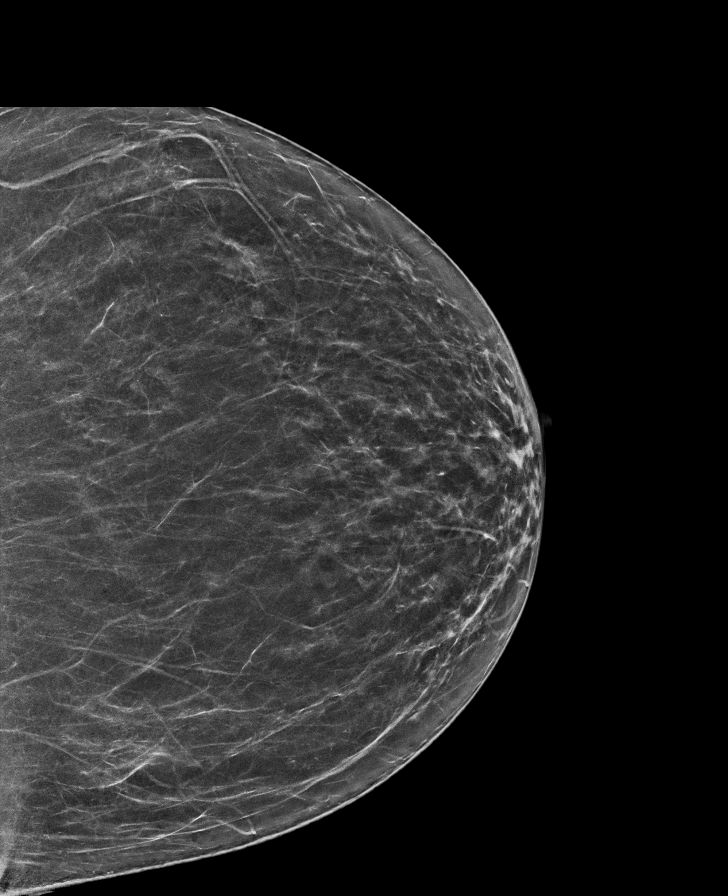

[R CC tomo · tomo slice 36/71.0]
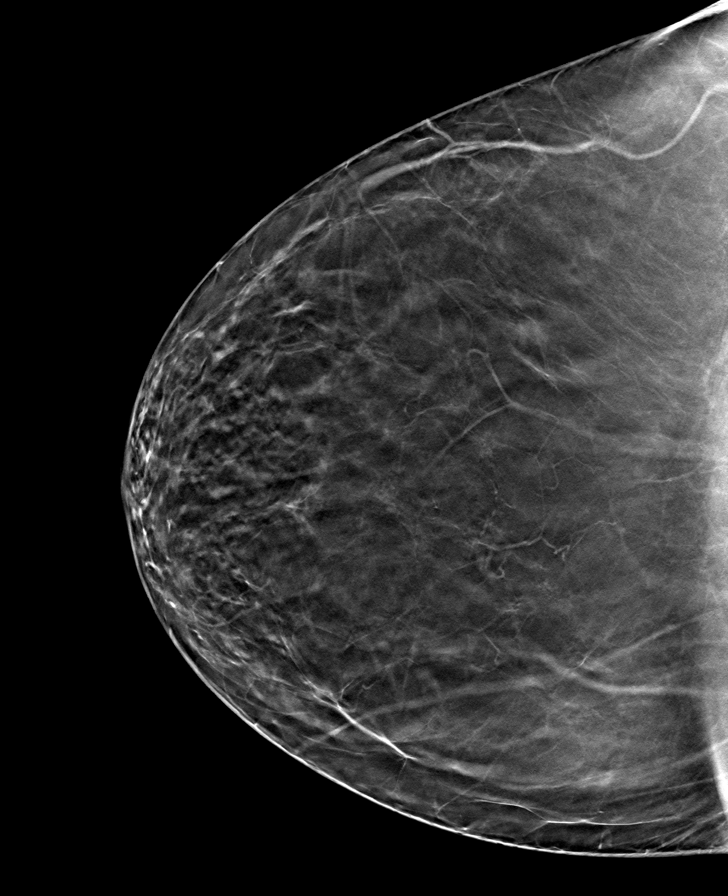

[R MLO tomo · tomo slice 41/80.0]
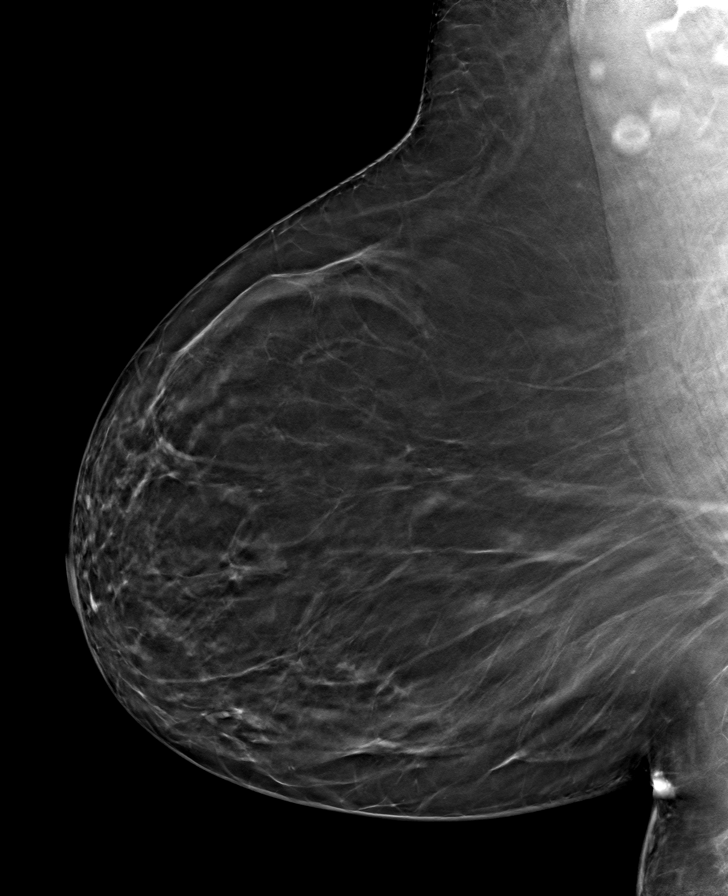

[L MLO tomo · tomo slice 43/84.0]
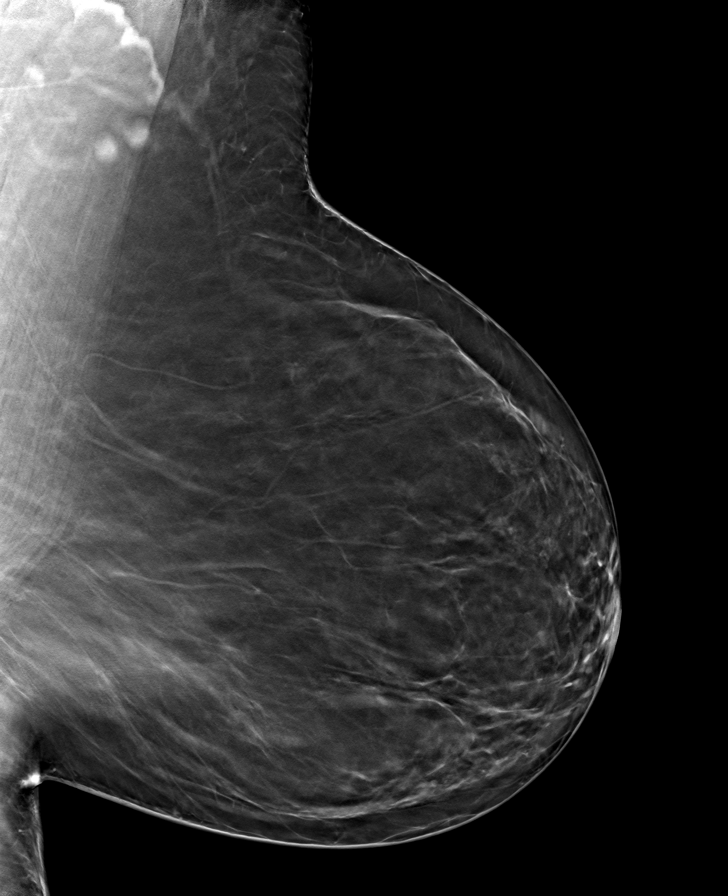

[L CC tomo · tomo slice 34/67.0]
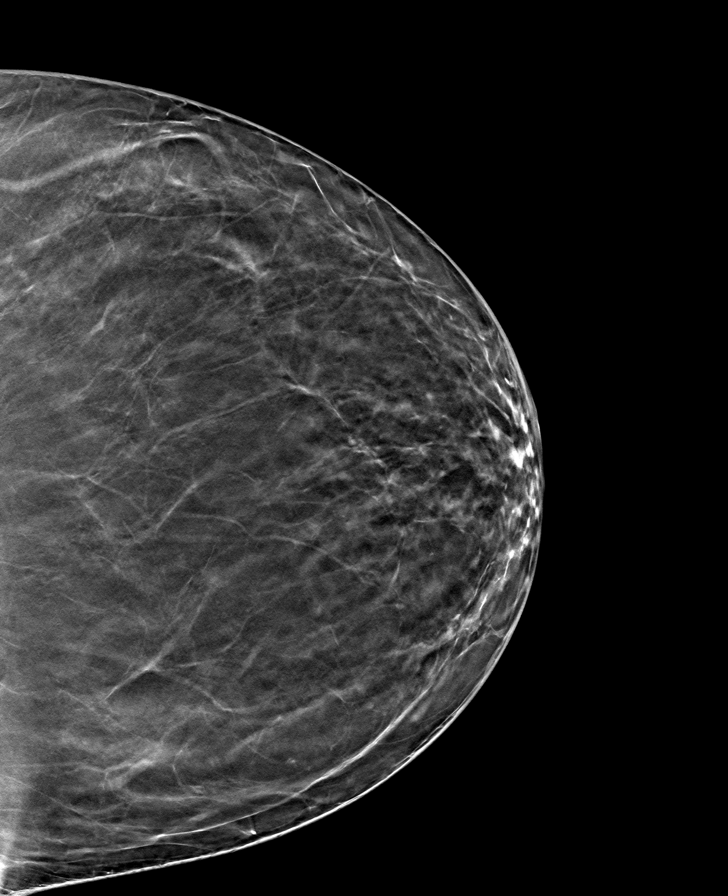

[8 of 24 positions shown; findings below may reference images not displayed]

ACR Breast Density Category b: There are scattered areas of
fibroglandular density.
FINDINGS: There are no findings suspicious for malignancy.
IMPRESSION: No mammographic evidence of malignancy. A result letter of this
screening mammogram will be mailed directly to the patient.

RECOMMENDATION:
Screening mammogram in one year. (Code:51-O-LD2)

BI-RADS CATEGORY  1: Negative.

## 2023-03-19 DIAGNOSIS — M25579 Pain in unspecified ankle and joints of unspecified foot: Secondary | ICD-10-CM | POA: Diagnosis not present

## 2023-03-19 DIAGNOSIS — M545 Low back pain, unspecified: Secondary | ICD-10-CM | POA: Diagnosis not present

## 2023-03-19 DIAGNOSIS — R109 Unspecified abdominal pain: Secondary | ICD-10-CM | POA: Diagnosis not present

## 2023-03-19 DIAGNOSIS — S0300XA Dislocation of jaw, unspecified side, initial encounter: Secondary | ICD-10-CM | POA: Diagnosis not present
